# Patient Record
Sex: Male | Born: 1960 | Race: White | Hispanic: No | Marital: Married | Smoking: Never smoker
Health system: Southern US, Community
[De-identification: ages and names within clinical notes are randomized; demographics above are authoritative.]

## PROBLEM LIST (undated history)

## (undated) DIAGNOSIS — R310 Gross hematuria: Secondary | ICD-10-CM

## (undated) DIAGNOSIS — D126 Benign neoplasm of colon, unspecified: Secondary | ICD-10-CM

## (undated) DIAGNOSIS — E782 Mixed hyperlipidemia: Secondary | ICD-10-CM

## (undated) DIAGNOSIS — E049 Nontoxic goiter, unspecified: Secondary | ICD-10-CM

## (undated) DIAGNOSIS — Q7649 Other congenital malformations of spine, not associated with scoliosis: Secondary | ICD-10-CM

## (undated) DIAGNOSIS — M7122 Synovial cyst of popliteal space [Baker], left knee: Secondary | ICD-10-CM

## (undated) DIAGNOSIS — T7840XA Allergy, unspecified, initial encounter: Secondary | ICD-10-CM

## (undated) DIAGNOSIS — K4091 Unilateral inguinal hernia, without obstruction or gangrene, recurrent: Secondary | ICD-10-CM

## (undated) DIAGNOSIS — L0501 Pilonidal cyst with abscess: Secondary | ICD-10-CM

## (undated) DIAGNOSIS — Z87442 Personal history of urinary calculi: Secondary | ICD-10-CM

## (undated) DIAGNOSIS — L649 Androgenic alopecia, unspecified: Secondary | ICD-10-CM

## (undated) HISTORY — DX: Mixed hyperlipidemia: E78.2

## (undated) HISTORY — DX: Unilateral inguinal hernia, without obstruction or gangrene, recurrent: K40.91

## (undated) HISTORY — PX: COLONOSCOPY: SHX174

## (undated) HISTORY — DX: Benign neoplasm of colon, unspecified: D12.6

## (undated) HISTORY — DX: Allergy, unspecified, initial encounter: T78.40XA

## (undated) HISTORY — PX: KNEE ARTHROSCOPY: SUR90

## (undated) HISTORY — DX: Pilonidal cyst with abscess: L05.01

## (undated) HISTORY — DX: Androgenic alopecia, unspecified: L64.9

## (undated) HISTORY — DX: Gross hematuria: R31.0

## (undated) HISTORY — DX: Nontoxic goiter, unspecified: E04.9

## (undated) HISTORY — PX: POLYPECTOMY: SHX149

## (undated) HISTORY — PX: BIOPSY THYROID: PRO38

## (undated) HISTORY — DX: Other congenital malformations of spine, not associated with scoliosis: Q76.49

---
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
## 2001-11-11 ENCOUNTER — Emergency Department (HOSPITAL_COMMUNITY): Admission: EM | Admit: 2001-11-11 | Discharge: 2001-11-11 | Payer: Self-pay | Admitting: Emergency Medicine

## 2005-08-11 ENCOUNTER — Ambulatory Visit: Payer: Self-pay | Admitting: Internal Medicine

## 2005-12-19 ENCOUNTER — Ambulatory Visit: Payer: Self-pay | Admitting: Internal Medicine

## 2006-08-30 ENCOUNTER — Emergency Department (HOSPITAL_COMMUNITY): Admission: EM | Admit: 2006-08-30 | Discharge: 2006-08-30 | Payer: Self-pay | Admitting: Family Medicine

## 2006-11-02 ENCOUNTER — Emergency Department (HOSPITAL_COMMUNITY): Admission: EM | Admit: 2006-11-02 | Discharge: 2006-11-02 | Payer: Self-pay | Admitting: Emergency Medicine

## 2008-11-04 ENCOUNTER — Emergency Department (HOSPITAL_COMMUNITY): Admission: EM | Admit: 2008-11-04 | Discharge: 2008-11-04 | Payer: Self-pay | Admitting: Emergency Medicine

## 2008-11-13 ENCOUNTER — Ambulatory Visit: Payer: Self-pay | Admitting: Sports Medicine

## 2008-11-13 ENCOUNTER — Encounter: Admission: RE | Admit: 2008-11-13 | Discharge: 2008-11-13 | Payer: Self-pay | Admitting: Vascular Surgery

## 2008-12-02 ENCOUNTER — Ambulatory Visit: Payer: Self-pay | Admitting: Sports Medicine

## 2008-12-02 DIAGNOSIS — Q7649 Other congenital malformations of spine, not associated with scoliosis: Secondary | ICD-10-CM

## 2008-12-02 HISTORY — DX: Other congenital malformations of spine, not associated with scoliosis: Q76.49

## 2008-12-30 ENCOUNTER — Ambulatory Visit: Payer: Self-pay | Admitting: Sports Medicine

## 2009-01-02 ENCOUNTER — Encounter: Payer: Self-pay | Admitting: Sports Medicine

## 2009-01-07 ENCOUNTER — Encounter: Payer: Self-pay | Admitting: Sports Medicine

## 2009-01-14 ENCOUNTER — Encounter: Payer: Self-pay | Admitting: Sports Medicine

## 2009-01-19 ENCOUNTER — Encounter: Payer: Self-pay | Admitting: Sports Medicine

## 2009-01-20 ENCOUNTER — Encounter: Admission: RE | Admit: 2009-01-20 | Discharge: 2009-04-20 | Payer: Self-pay | Admitting: Sports Medicine

## 2009-01-21 ENCOUNTER — Encounter: Payer: Self-pay | Admitting: Sports Medicine

## 2009-02-04 ENCOUNTER — Encounter: Admission: RE | Admit: 2009-02-04 | Discharge: 2009-02-04 | Payer: Self-pay | Admitting: Family Medicine

## 2009-02-04 ENCOUNTER — Ambulatory Visit: Payer: Self-pay | Admitting: Family Medicine

## 2009-02-04 ENCOUNTER — Ambulatory Visit: Payer: Self-pay | Admitting: Sports Medicine

## 2009-02-04 LAB — CONVERTED CEMR LAB
Anti Nuclear Antibody(ANA): NEGATIVE
Basophils Absolute: 0.1 10*3/uL (ref 0.0–0.1)
CRP, High Sensitivity: 38.8 — ABNORMAL HIGH
Eosinophils Absolute: 0.1 10*3/uL (ref 0.0–0.7)
Eosinophils Relative: 1 % (ref 0–5)
HCT: 39.8 % (ref 39.0–52.0)
Hemoglobin: 13.2 g/dL (ref 13.0–17.0)
Lymphocytes Relative: 20 % (ref 12–46)
MCV: 84.7 fL (ref 78.0–100.0)
Monocytes Absolute: 0.7 10*3/uL (ref 0.1–1.0)
RDW: 13.3 % (ref 11.5–15.5)

## 2009-02-06 ENCOUNTER — Encounter (INDEPENDENT_AMBULATORY_CARE_PROVIDER_SITE_OTHER): Payer: Self-pay | Admitting: *Deleted

## 2009-02-09 ENCOUNTER — Encounter: Payer: Self-pay | Admitting: Sports Medicine

## 2009-02-11 ENCOUNTER — Ambulatory Visit (HOSPITAL_COMMUNITY): Admission: RE | Admit: 2009-02-11 | Discharge: 2009-02-11 | Payer: Self-pay | Admitting: Rheumatology

## 2009-02-12 ENCOUNTER — Encounter (INDEPENDENT_AMBULATORY_CARE_PROVIDER_SITE_OTHER): Payer: Self-pay | Admitting: Interventional Radiology

## 2009-02-12 ENCOUNTER — Encounter: Payer: Self-pay | Admitting: Endocrinology

## 2009-02-12 ENCOUNTER — Ambulatory Visit (HOSPITAL_COMMUNITY): Admission: RE | Admit: 2009-02-12 | Discharge: 2009-02-12 | Payer: Self-pay | Admitting: Rheumatology

## 2009-02-17 ENCOUNTER — Encounter: Payer: Self-pay | Admitting: Sports Medicine

## 2009-03-02 ENCOUNTER — Ambulatory Visit: Payer: Self-pay | Admitting: Endocrinology

## 2009-03-02 DIAGNOSIS — E041 Nontoxic single thyroid nodule: Secondary | ICD-10-CM | POA: Insufficient documentation

## 2009-03-02 DIAGNOSIS — E049 Nontoxic goiter, unspecified: Secondary | ICD-10-CM

## 2009-03-02 HISTORY — DX: Nontoxic goiter, unspecified: E04.9

## 2009-05-07 ENCOUNTER — Ambulatory Visit (HOSPITAL_COMMUNITY): Admission: RE | Admit: 2009-05-07 | Discharge: 2009-05-07 | Payer: Self-pay

## 2009-08-12 ENCOUNTER — Telehealth: Payer: Self-pay | Admitting: Endocrinology

## 2009-08-14 ENCOUNTER — Ambulatory Visit: Payer: Self-pay | Admitting: Endocrinology

## 2009-08-17 ENCOUNTER — Ambulatory Visit: Payer: Self-pay | Admitting: Endocrinology

## 2009-08-25 ENCOUNTER — Ambulatory Visit (HOSPITAL_COMMUNITY): Admission: RE | Admit: 2009-08-25 | Discharge: 2009-08-25 | Payer: Self-pay | Admitting: Endocrinology

## 2010-08-10 ENCOUNTER — Ambulatory Visit: Payer: Self-pay | Admitting: Emergency Medicine

## 2010-08-11 ENCOUNTER — Telehealth (INDEPENDENT_AMBULATORY_CARE_PROVIDER_SITE_OTHER): Payer: Self-pay

## 2010-11-11 NOTE — Progress Notes (Signed)
  Phone Note Outgoing Call   Call placed by: Areta Haber CMA,  August 11, 2010 10:09 AM Call placed to: Patient Summary of Call: Courtesy call to pt - Pt states he's improving from 08/10/10 vist. Pt was reminded to complete all antibiotics as intructed. Pt stated he understood. Initial call taken by: Areta Haber CMA,  August 11, 2010 10:10 AM

## 2010-11-11 NOTE — Assessment & Plan Note (Signed)
Summary: INJURY TO BIG TOE LEFT FOOT/TJ   Vital Signs:  Patient Profile:   50 Years Old Male CC:      left great toe pain Height:     72 inches (182.88 cm) Weight:      233.25 pounds O2 Sat:      98 % O2 treatment:    Room Air Temp:     98.6 degrees F oral Pulse rate:   69 / minute Resp:     16 per minute BP sitting:   147 / 80  (left arm) Cuff size:   large  Pt. in pain?   yes    Location:   left great toe    Intensity:   7    Type:       throbbing  Vitals Entered By: Lajean Saver RN (August 10, 2010 7:11 PM)                   Updated Prior Medication List: No Medications Current Allergies: ! BETA BLOCKERS ! * IVP DYE IBUPROFENHistory of Present Illness History from: patient Chief Complaint: left great toe pain History of Present Illness: L great toe pain.  Kicked a soccer ball last week with the opposite foot and the foot came back and hit his L big toe.  Became red and swollen and started to drain and was getting better.  Then bumped it again and it got swollen again.  Tenderness, pain, redness.  No current drainage.  No F/C/N/V.  REVIEW OF SYSTEMS Constitutional Symptoms      Denies fever, chills, night sweats, weight loss, weight gain, and fatigue.  Eyes       Denies change in vision, eye pain, eye discharge, glasses, contact lenses, and eye surgery. Ear/Nose/Throat/Mouth       Denies hearing loss/aids, change in hearing, ear pain, ear discharge, dizziness, frequent runny nose, frequent nose bleeds, sinus problems, sore throat, hoarseness, and tooth pain or bleeding.  Respiratory       Denies dry cough, productive cough, wheezing, shortness of breath, asthma, bronchitis, and emphysema/COPD.  Cardiovascular       Denies murmurs, chest pain, and tires easily with exhertion.    Gastrointestinal       Denies stomach pain, nausea/vomiting, diarrhea, constipation, blood in bowel movements, and indigestion. Genitourniary       Denies painful urination, kidney  stones, and loss of urinary control. Neurological       Denies paralysis, seizures, and fainting/blackouts. Musculoskeletal       Complains of joint pain, redness, and swelling.      Denies muscle pain, joint stiffness, decreased range of motion, muscle weakness, and gout.      Comments: left great toe Skin       Complains of hair/skni or nail changes.      Denies bruising and unusual mles/lumps or sores.  Psych       Denies mood changes, temper/anger issues, anxiety/stress, speech problems, depression, and sleep problems. Other Comments: Patient bumped toe on shoe about 1 week ago. He hit it again three days later and then peeled off part of the nail that was hanging. Since it has become very swollen, red, and painful   Past History:  Past Medical History: Reviewed history from 08/17/2009 and no changes required. 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 orthoscopic surgery on left eye - tore a ligament in 1983  Family History: Reviewed history from 03/02/2009 and no changes required. 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  Never Smoked Alcohol use-yes Drug use-no Smoking Status:  never Drug Use:  no Physical Exam General appearance: well developed, well nourished, no acute distress.  Mild limp. Extremities: see below Neurological: normal sensation Skin: see below L great toe with redness around medial toenail c/w paronychia.  Tender at that area.  MTP and IP are FROM, no tednerness.  Squeezing bone is not painful.  Toe is  slightly swollen and erythema dorsal aspect.  The rest of his toes and his foot exam are normal. Assessment New Problems: PARONYCHIA, LEFT GREAT TOE (ICD-681.11) TOE PAIN (ICD-729.5)   Patient Education: Patient and/or caregiver instructed in the following: rest, fluids, Tylenol prn.  Plan New Medications/Changes: BACTRIM DS 800-160 MG TABS (SULFAMETHOXAZOLE-TRIMETHOPRIM) 1 tab by mouth two times a day for 10 days  #20 x 0, 08/10/2010, Hoyt Koch MD  New Orders: New Patient Level III [phone] Planning Comments:   Rx for Bactrim is given as the [facility].  Because of his h/o Ibuprofen allergy, caution with taking any antibiotics or anti-inflammatories.  If any reaction, stop medicine and seek medical attention.  Use Tylenol instead for pain.  Take antibiotics and soak a few times a day in warm water.  Avoid further trauma, including playing soccer until healed.   If not improving in about a week, patient should see podiatry for further evaluation.   The patient and/or caregiver has been counseled thoroughly with regard to medications prescribed including dosage, schedule, interactions, rationale for use, and possible side effects and they verbalize understanding.  Diagnoses and expected course of recovery discussed and will return if not improved as expected or if the condition worsens. Patient and/or caregiver verbalized understanding.  Prescriptions: BACTRIM DS 800-160 MG TABS (SULFAMETHOXAZOLE-TRIMETHOPRIM) 1 tab by mouth two times a day for 10 days  #20 x 0   Entered and Authorized by:   Hoyt Koch MD   Signed by:   Hoyt Koch MD on 08/10/2010   Method used:   Print then Give to Patient   RxID:   [id number]   Orders Added: 1)  New Patient Level III [95621]

## 2011-01-24 LAB — CK: Total CK: 166 U/L (ref 7–232)

## 2011-03-31 ENCOUNTER — Ambulatory Visit: Payer: Self-pay

## 2011-07-15 ENCOUNTER — Inpatient Hospital Stay (INDEPENDENT_AMBULATORY_CARE_PROVIDER_SITE_OTHER)
Admission: RE | Admit: 2011-07-15 | Discharge: 2011-07-15 | Disposition: A | Discharge status: Home / Self Care | Payer: 59 | Source: Ambulatory Visit | Attending: Family Medicine | Admitting: Family Medicine

## 2011-07-15 ENCOUNTER — Encounter: Payer: Self-pay | Admitting: Family Medicine

## 2011-07-15 DIAGNOSIS — L0501 Pilonidal cyst with abscess: Secondary | ICD-10-CM

## 2011-07-15 HISTORY — DX: Pilonidal cyst with abscess: L05.01

## 2011-09-12 NOTE — Progress Notes (Signed)
Summary: cyst on tail bone Procedure room   Vital Signs:  Patient Profile:   50 Years Old Male CC:      Cyst on coccyx x 4-5 days Height:     72 inches (182.88 cm) Weight:      233 pounds O2 Sat:      97 % O2 treatment:    Room Air Temp:     98.9 degrees F oral Pulse rate:   71 / minute Pulse rhythm:   regular Resp:     16 per minute BP sitting:   127 / 83  (left arm) Cuff size:   regular  Vitals Entered By: Emilio Math (July 15, 2011 1:44 PM)                  Prior Medication List:  BACTRIM DS 800-160 MG TABS (SULFAMETHOXAZOLE-TRIMETHOPRIM) 1 tab by mouth two times a day for 10 days   Current Allergies (reviewed today): ! BETA BLOCKERS ! * IVP DYE IBUPROFENHistory of Present Illness Chief Complaint: Cyst on coccyx x 4-5 days History of Present Illness: Patient ghas noted pain on the cocyx area. Hx of pilonidal cyst before.  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     Denies chills, night sweats, weight loss, weight gain, and fatigue.  Eyes       Denies change in vision, eye pain, eye discharge, glasses, contact lenses, and eye surgery. Ear/Nose/Throat/Mouth       Denies hearing loss/aids, change in hearing, ear pain, ear discharge, dizziness, frequent runny nose, frequent nose bleeds, sinus problems, sore throat, hoarseness, and tooth pain or bleeding.  Respiratory       Denies dry cough, productive cough, wheezing,  shortness of breath, asthma, bronchitis, and emphysema/COPD.  Cardiovascular       Denies murmurs, chest pain, and tires easily with exhertion.    Gastrointestinal       Denies stomach pain, nausea/vomiting, diarrhea, constipation, blood in bowel movements, and indigestion. Genitourniary       Denies painful urination, kidney stones, and loss of urinary control. Neurological       Denies paralysis, seizures, and fainting/blackouts. Musculoskeletal       Denies muscle pain, joint pain, joint stiffness, decreased range of motion, redness, swelling, muscle weakness, and gout.  Skin       Complains of unusual moles/lumps or sores.      Denies bruising and hair/skin or nail changes.  Psych       Denies mood changes, temper/anger issues, anxiety/stress, speech problems, depression, and sleep problems.  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  Never Smoked Alcohol use-yes Drug use-no  Risk Factors: Smoking Status: never (08/10/2010)  Past Medical History: Reviewed history from 08/17/2009 and no changes required. 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 orthoscopic surgery on left eye - tore a ligament in 1983  Family History: Reviewed history from 03/02/2009 and no changes required. mother had a thyroidectomy in the 1960's--benign "hot nodules."  Social History: Reviewed history from 08/10/2010 and no changes required. Married to head of ICU  dept at [employer]. [employer] as Product/process development scientist - residential homes.  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) MUSCLE STRAIN, HAMSTRING MUSCLE (ICD-844.8) New Problems: PILONIDAL CYST WITH ABSCESS (ICD-685.0)   Plan New Medications/Changes: BACTROBAN 2 % OINT (MUPIROCIN) appply to area 3x am day  #1 tube x 2, 07/15/2011, Hassan Rowan MD DOXYCYCLINE HYCLATE 100 MG CAPS (DOXYCYCLINE HYCLATE) Take 1 tab twice a day  #20 x 0, 07/15/2011, Hassan Rowan MD  New Orders: New Patient Level III [phone] Planning Comments:   if not improving over next 3-4 days may need I&D at that time  Follow Up: Follow up in 2-3 days if no improvement  The patient and/or caregiver has been counseled thoroughly with regard to medications prescribed including dosage, schedule, interactions, rationale for use, and possible side effects and they verbalize understanding.  Diagnoses and expected course of recovery discussed and will return if not improved as expected or if the condition worsens. Patient and/or caregiver verbalized understanding.  Prescriptions: BACTROBAN 2 % OINT (MUPIROCIN) appply to area 3x am day  #1 tube x 2   Entered and Authorized by:   Hassan Rowan MD   Signed by:   Hassan Rowan MD on 07/15/2011   Method used:   Printed then faxed to ...       [facility]  [address] [phone]* (retail)       [address] [place]  [address]        Ph: [phone] or [phone]       Fax: [phone]   RxID:   [id number] DOXYCYCLINE HYCLATE 100 MG CAPS (DOXYCYCLINE HYCLATE) Take 1 tab twice a day  #20 x 0   Entered and Authorized by:   Hassan Rowan MD   Signed by:   Hassan Rowan MD on 07/15/2011   Method used:   Printed then faxed to ...       [facility]  [address] [phone]* (retail)       [address] [place]  [address]       Ph: [phone] or [phone]       Fax: [phone]   RxID:   [id number]   Patient Instructions: 1)  Please schedule a follow-up appointment as needed. 2)  Please schedule an appointment with your primary doctor in :3-4 days if needed 3)  Take your antibiotic as prescribed until ALL of it is  gone, but stop if you develop a rash or swelling and contact our office as soon as possible.  Orders Added: 1)  New Patient Level III [[address]109]

## 2011-09-15 ENCOUNTER — Emergency Department
Admission: EM | Admit: 2011-09-15 | Discharge: 2011-09-15 | Disposition: A | Discharge status: Home / Self Care | Payer: 59 | Source: Home / Self Care

## 2011-09-15 ENCOUNTER — Encounter: Payer: Self-pay | Admitting: *Deleted

## 2011-09-15 DIAGNOSIS — Z23 Encounter for immunization: Secondary | ICD-10-CM

## 2011-09-15 MED ORDER — INFLUENZA VAC TYP A&B SURF ANT IM INJ
0.5000 mL | INJECTION | INTRAMUSCULAR | Status: AC | PRN
  Administered 2011-09-15: 0.5 mL via INTRAMUSCULAR

## 2011-09-15 MED ORDER — INFLUENZA VAC TYP A&B SURF ANT IM INJ: 0.5000 mL | INJECTION | INTRAMUSCULAR | Status: DC

## 2012-04-06 ENCOUNTER — Telehealth: Payer: Self-pay

## 2012-04-06 NOTE — Telephone Encounter (Signed)
error 

## 2012-04-27 ENCOUNTER — Emergency Department
Admission: EM | Admit: 2012-04-27 | Discharge: 2012-04-27 | Disposition: A | Discharge status: Home / Self Care | Payer: 59 | Source: Home / Self Care

## 2012-04-27 DIAGNOSIS — R221 Localized swelling, mass and lump, neck: Secondary | ICD-10-CM

## 2012-04-27 NOTE — ED Provider Notes (Signed)
History     CSN: [id number]  Arrival date & time 04/27/12  1442   First MD Initiated Contact with Patient 04/27/12 1446      Chief Complaint  Patient presents with  . Mass    on right side of neck     HPI Comments: Pt presents today with chief complaint of R neck mass Pt states that he noticed mass 1-2 days ago while he was in the shower.  Pt denies noticing mass prior to this.  Pt denies pain or irritation.  No redness.  Pt denies any recent infections.  No trauma. No prior hx/o malignancy though pt does report hx/o benign thyroid mass that has been imaged and biopsied Pt also reports hx/o idiopathic angioedema which he thinks is related to ibuprofen use.  No recent episodes of angioedema.  No rashes. No joint pain, myalgias, or easy bruising.  No unintentional weight loss.  No alcohol intake.  Pt is a non smoker.      History reviewed. No pertinent past medical history.  Past Surgical History  Procedure Date  . Knee arthroscopy     left    Family History  Problem Relation Age of Onset  . Cancer Father     bladder    History  Substance Use Topics  . Smoking status: Never Smoker   . Smokeless tobacco: Never Used  . Alcohol Use: 4.8 oz/week    8 Cans of beer per week      Review of Systems  All other systems reviewed and are negative.    Allergies  Beta adrenergic blockers and Ibuprofen  Home Medications   Current Outpatient Rx  Name Route Sig Dispense Refill  . CETIRIZINE HCL 10 MG PO TABS Oral Take 10 mg by mouth daily.      BP 122/75  Pulse 65  Temp 98.2 F (36.8 C) (Oral)  Resp 16  Ht 6' (1.829 m)  Wt 235 lb (106.595 kg)  BMI 31.87 kg/m2  SpO2 97%  Physical Exam  Constitutional: He appears well-developed and well-nourished.  HENT:  Head: Normocephalic and atraumatic.  Right Ear: External ear normal.  Left Ear: External ear normal.  Mouth/Throat: Oropharynx is clear and moist.  Eyes: Conjunctivae are normal. Pupils are equal,  round, and reactive to light.  Neck: Normal range of motion. No JVD present. No tracheal deviation present.    Cardiovascular: Normal rate and regular rhythm.   Pulmonary/Chest: Effort normal and breath sounds normal. No stridor.  Abdominal: Soft. Bowel sounds are normal.  Musculoskeletal: Normal range of motion.  Lymphadenopathy:    He has no cervical adenopathy.  Neurological: He is alert.  Skin: Skin is warm. No rash noted.    ED Course  Procedures (including critical care time)  Labs Reviewed - No data to display No results found.   No diagnosis found.    MDM  Differential remains relatively broad.  Lymphadenitis less likely given lack of systemic sxs and nontender mass.  Given hx/o thyroid mass (benign) and location (posterior triangle), higher concern for malignancy.  Pt formally referred for same day appt to ENT for evaluation (pt going out of town early next week.) SDA made for [facility]. Pt to see Dr. Jenne Pane.  Pt informed of follow up and is agreeable.          Floydene Flock, MD 04/27/12 [phone]

## 2012-04-27 NOTE — ED Notes (Signed)
Gary Collins complains of bump on the left side of his neck for 1 day. Denies fever, chill, sweats, tenderness, pain or redness.

## 2012-04-28 NOTE — ED Provider Notes (Signed)
Agree with exam, assessment, and plan.   Lattie Haw, MD 04/28/12 [phone]

## 2012-06-12 ENCOUNTER — Emergency Department (INDEPENDENT_AMBULATORY_CARE_PROVIDER_SITE_OTHER)
Admission: EM | Admit: 2012-06-12 | Discharge: 2012-06-12 | Disposition: A | Discharge status: Home / Self Care | Payer: 59 | Source: Home / Self Care | Attending: Family Medicine | Admitting: Family Medicine

## 2012-06-12 ENCOUNTER — Encounter (HOSPITAL_COMMUNITY): Payer: Self-pay | Admitting: *Deleted

## 2012-06-12 ENCOUNTER — Emergency Department (HOSPITAL_COMMUNITY)
Admission: EM | Admit: 2012-06-12 | Discharge: 2012-06-12 | Payer: Self-pay | Attending: Family Medicine | Admitting: Family Medicine

## 2012-06-12 DIAGNOSIS — H6120 Impacted cerumen, unspecified ear: Secondary | ICD-10-CM

## 2012-06-12 DIAGNOSIS — L255 Unspecified contact dermatitis due to plants, except food: Secondary | ICD-10-CM

## 2012-06-12 DIAGNOSIS — L237 Allergic contact dermatitis due to plants, except food: Secondary | ICD-10-CM

## 2012-06-12 DIAGNOSIS — H918X9 Other specified hearing loss, unspecified ear: Secondary | ICD-10-CM

## 2012-06-12 MED ORDER — FLUTICASONE PROPIONATE 0.05 % EX CREA: TOPICAL_CREAM | Freq: Two times a day (BID) | CUTANEOUS | Status: DC

## 2012-06-12 NOTE — ED Notes (Signed)
Pt reports wax build up in right ear and posion ivy on legs

## 2012-06-12 NOTE — ED Provider Notes (Signed)
History     CSN: [id number]  Arrival date & time 06/12/12  1618   First MD Initiated Contact with Patient 06/12/12 1624      Chief Complaint  Patient presents with  . Cerumen Impaction    (Consider location/radiation/quality/duration/timing/severity/associated sxs/prior treatment) Patient is a 51 y.o. male presenting with plugged ear sensation. The history is provided by the patient.  Ear Fullness This is a new problem. The current episode started more than 2 days ago (tubing on lake and water impact caused ear blockage.). The problem has not changed since onset.   History reviewed. No pertinent past medical history.  Past Surgical History  Procedure Date  . Knee arthroscopy     left    Family History  Problem Relation Age of Onset  . Cancer Father     bladder    History  Substance Use Topics  . Smoking status: Never Smoker   . Smokeless tobacco: Never Used  . Alcohol Use: 4.8 oz/week    8 Cans of beer per week      Review of Systems  Constitutional: Negative.   HENT: Positive for hearing loss. Negative for ear pain, tinnitus and ear discharge.     Allergies  Beta adrenergic blockers and Ibuprofen  Home Medications   Current Outpatient Rx  Name Route Sig Dispense Refill  . CETIRIZINE HCL 10 MG PO TABS Oral Take 10 mg by mouth daily.    Marland Kitchen FLUTICASONE PROPIONATE 0.05 % EX CREA Topical Apply topically 2 (two) times daily. 60 g 0    BP 118/70  Pulse 64  Temp 97.8 F (36.6 C) (Oral)  Resp 18  SpO2 98%  Physical Exam  Nursing note and vitals reviewed. Constitutional: He appears well-developed and well-nourished.  HENT:  Head: Normocephalic.  Ears:  Mouth/Throat: Oropharynx is clear and moist.  Skin: Skin is warm and dry. Rash noted.       Papulovesicular patchy rash on lower ext.    ED Course  Procedures (including critical care time)  Labs Reviewed - No data to display No results found.   1. Hearing loss secondary to cerumen impaction     2. Contact dermatitis due to poison ivy       MDM  Sx resolved with irrig, canals clear and tm nl bilat.        Linna Hoff, MD 06/12/12 [phone]

## 2012-08-06 ENCOUNTER — Telehealth: Payer: Self-pay | Admitting: Endocrinology

## 2012-08-06 NOTE — Telephone Encounter (Signed)
The patient called hoping to get a referral for a MRI of his thyroid.

## 2012-08-07 NOTE — Telephone Encounter (Signed)
Ov would be needed to consider 

## 2012-08-07 NOTE — Telephone Encounter (Signed)
Called Gary Collins and advised he needs an OV; not seen in 3 yrs. Gary Collins stated he did not want an MRI, he wants an untrasound. I told Gary Collins I would check with Dr. Everardo All. Please advise.

## 2012-08-07 NOTE — Telephone Encounter (Signed)
Last seen 3 years ago.  Please advise ov

## 2012-08-07 NOTE — Telephone Encounter (Signed)
Called pt and left a vm letting him know that he would need an OV to discuss and consider having an ultrasound on his thyroid.

## 2012-08-07 NOTE — Telephone Encounter (Signed)
Pt called requesting a referral for MRI of his thyroid. Please advise.

## 2012-09-14 ENCOUNTER — Encounter: Payer: Self-pay | Admitting: Endocrinology

## 2012-09-14 ENCOUNTER — Ambulatory Visit (INDEPENDENT_AMBULATORY_CARE_PROVIDER_SITE_OTHER): Payer: 59 | Admitting: Endocrinology

## 2012-09-14 VITALS — BP 126/80 | HR 66 | Temp 98.5°F | Wt 239.0 lb

## 2012-09-14 DIAGNOSIS — E049 Nontoxic goiter, unspecified: Secondary | ICD-10-CM

## 2012-09-14 LAB — TSH: TSH: 1.433 u[IU]/mL (ref 0.350–4.500)

## 2012-09-14 NOTE — Patient Instructions (Addendum)
blood tests are being requested for you today.  We'll contact you with results. Let's recheck the ultrasound.  you will receive a phone call, about a day and time for an appointment Please return in 2 years.

## 2012-09-14 NOTE — Progress Notes (Signed)
  Subjective:    Patient ID: Gary Collins, male    DOB: [birth year], 51 y.o.   MRN: [id number]  HPI In 2012, pt was incidentally noted on MRI to have a right thyroid mass.  bx showed non-neoplastic goiter.  He does not notice the goiter.  pt states he feels well in general.   No past medical history on file.  Past Surgical History  Procedure Date  . Knee arthroscopy     left    History   Social History  . Marital Status: Married    Spouse Name: [name]    Number of Children: [name]  . Years of Education: [name]   Occupational History  . Not on file.   Social History Main Topics  . Smoking status: Never Smoker   . Smokeless tobacco: Never Used  . Alcohol Use: 4.8 oz/week    8 Cans of beer per week  . Drug Use: No  . Sexually Active:    Other Topics Concern  . Not on file   Social History Narrative  . No narrative on file    Current Outpatient Prescriptions on File Prior to Visit  Medication Sig Dispense Refill  . cetirizine (ZYRTEC) 10 MG tablet Take 10 mg by mouth daily.      . fluticasone (CUTIVATE) 0.05 % cream Apply topically 2 (two) times daily.  60 g  0    Allergies  Allergen Reactions  . Beta Adrenergic Blockers   . Ibuprofen     REACTION: lip swelling    Family History  Problem Relation Age of Onset  . Cancer Father     bladder   BP 126/80  Pulse 66  Temp 98.5 F (36.9 C) (Oral)  Wt 239 lb (108.41 kg)  SpO2 98%  Review of Systems Denies tremor.      Objective:   Physical Exam VITAL SIGNS:  See vs page GENERAL: no distress NECK: There is no palpable thyroid enlargement.  No thyroid nodule is palpable.  No palpable lymphadenopathy at the anterior neck.  Lab Results  Component Value Date   TSH 1.433 09/14/2012      Assessment & Plan:  Thyroid nodule, nonpalpable.  due for f/u US.

## 2012-09-18 ENCOUNTER — Other Ambulatory Visit: Payer: 59

## 2012-09-18 ENCOUNTER — Telehealth: Payer: Self-pay | Admitting: Endocrinology

## 2012-09-18 NOTE — Telephone Encounter (Signed)
DEBORAH, PCC, AT [facility] NOTIFIED OF PATIENT REQUEST TO HAVE ULTRASOUND AT [facility]. DEBORAH TO CALL PATIENT WITH NEW DATE AND TIME.

## 2012-09-18 NOTE — Telephone Encounter (Signed)
Patient states that he has an ultrasound scheduled for this afternoon at [facility]. He would like to cancel this appointment and have the ultrasound done at [facility]. He states any day is fine from 8-1. He says that the reason he is cancelling this appointment is because his wife works for [employer] and would be less expensive this way.

## 2012-09-19 ENCOUNTER — Ambulatory Visit (HOSPITAL_COMMUNITY): Payer: 59

## 2012-09-20 ENCOUNTER — Telehealth: Payer: Self-pay | Admitting: *Deleted

## 2012-09-20 ENCOUNTER — Ambulatory Visit (HOSPITAL_COMMUNITY)
Admission: RE | Admit: 2012-09-20 | Discharge: 2012-09-20 | Disposition: A | Discharge status: Home / Self Care | Payer: 59 | Source: Ambulatory Visit | Attending: Endocrinology | Admitting: Endocrinology

## 2012-09-20 DIAGNOSIS — E041 Nontoxic single thyroid nodule: Secondary | ICD-10-CM | POA: Insufficient documentation

## 2012-09-20 NOTE — Telephone Encounter (Signed)
Message copied by Elnora Morrison on Thu Sep 20, 2012  4:29 PM ------      Message from: Romero Belling      Created: Thu Sep 20, 2012  3:36 PM       please call patient:      No change--good      i'll see you next time.

## 2012-09-20 NOTE — Telephone Encounter (Signed)
PATIENT NOTIFIED OF U/S REPORT PER DR. ELLISON OF NO CHANGE.

## 2012-10-08 ENCOUNTER — Ambulatory Visit (INDEPENDENT_AMBULATORY_CARE_PROVIDER_SITE_OTHER): Payer: 59 | Admitting: Family Medicine

## 2012-10-08 ENCOUNTER — Encounter: Payer: Self-pay | Admitting: Family Medicine

## 2012-10-08 VITALS — BP 134/86 | HR 69 | Temp 98.6°F | Ht 72.0 in | Wt 237.0 lb

## 2012-10-08 DIAGNOSIS — Z23 Encounter for immunization: Secondary | ICD-10-CM

## 2012-10-08 DIAGNOSIS — Z7689 Persons encountering health services in other specified circumstances: Secondary | ICD-10-CM

## 2012-10-08 DIAGNOSIS — Z0000 Encounter for general adult medical examination without abnormal findings: Secondary | ICD-10-CM

## 2012-10-08 DIAGNOSIS — Z7189 Other specified counseling: Secondary | ICD-10-CM

## 2012-10-08 NOTE — Assessment & Plan Note (Signed)
Reviewed history and his upcoming need for CPE. He'll call with info about which GI MD his wife recommends him to see. He'll arrange CPE at his convenience and he'll come by for fasting health panel and PSA the week prior to his CPE. Tdap vaccine given IM today.

## 2012-10-08 NOTE — Progress Notes (Signed)
Office Note 10/08/2012  CC:  Chief Complaint  Patient presents with  . Establish Care    turned 51 this year, last CPE 4-5 years ago; transfer from Dr. Cato Mulligan    HPI:  Gary Collins is a 51 y.o. White male who is here to transfer care. Patient's most recent primary MD: Dr. Cato Mulligan at [facility] [place]. Old records in EPIC/HL were reviewed prior to or during today's visit.  Patient without c/o today.   Past Medical History  Diagnosis Date  . Pilonidal cyst with abscess 07/15/2011    Qualifier: Diagnosis of  By: Thurmond Butts MD, Dennard Nip    . 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  Followed by Dr. Everardo All.   No changes on u/s f/u 09/2012    Past Surgical History  Procedure Date  . Knee arthroscopy     left    Family History  Problem Relation Age of Onset  . Cancer Father     bladder    History   Social History  . Marital Status: Married    Spouse Name: [name]    Number of Children: [name]  . Years of Education: [name]   Occupational History  . Not on file.   Social History Main Topics  . Smoking status: Never Smoker   . Smokeless tobacco: Never Used  . Alcohol Use: 4.8 oz/week    8 Cans of beer per week  . Drug Use: No  . Sexually Active:    Other Topics Concern  . Not on file   Social History Narrative   Married, 50 y/o girl and 25 boy.Occupation: Product/process development scientist (residential).Orig from [place], [place].Enjoys fishing.  Exercises 2-3 times per week: 30 min elliptical.  Also plays soccer.No tob, occ beer, no drugs.    Outpatient Encounter Prescriptions as of 10/08/2012  Medication Sig Dispense Refill  . cetirizine (ZYRTEC) 10 MG tablet Take 10 mg by mouth daily.      . [DISCONTINUED] fluticasone (CUTIVATE) 0.05 % cream Apply topically 2 (two) times daily.  60 g  0  Fish oil 1000 mg bid  Allergies  Allergen Reactions  . Beta Adrenergic Blockers   . Ibuprofen     REACTION: lip swelling   PE; Blood  pressure 134/86, pulse 69, temperature 98.6 F (37 C), temperature source Temporal, height 6' (1.829 m), weight 237 lb (107.502 kg), SpO2 98.00%. Gen: Alert, well appearing.  Patient is oriented to person, place, time, and situation. No further exam today.  Pertinent labs:  Lab Results  Component Value Date   TSH 1.433 09/14/2012     ASSESSMENT AND PLAN:   Encounter to establish care with new doctor Reviewed history and his upcoming need for CPE. He'll call with info about which GI MD his wife recommends him to see. He'll arrange CPE at his convenience and he'll come by for fasting health panel and PSA the week prior to his CPE. Tdap vaccine given IM today.  An After Visit Summary was printed and given to the patient.  Spent with pt today, with >50% of this time spent in counseling and care coordination.  Return for CPE at pt's convenience, with lab visit for fasting blood work the week prior.

## 2012-10-18 ENCOUNTER — Other Ambulatory Visit (INDEPENDENT_AMBULATORY_CARE_PROVIDER_SITE_OTHER): Payer: 59

## 2012-10-18 DIAGNOSIS — Z0000 Encounter for general adult medical examination without abnormal findings: Secondary | ICD-10-CM

## 2012-10-18 LAB — COMPREHENSIVE METABOLIC PANEL
ALT: 18 U/L (ref 0–53)
AST: 30 U/L (ref 0–37)
CO2: 30 mEq/L (ref 19–32)
Calcium: 9 mg/dL (ref 8.4–10.5)
Chloride: 102 mEq/L (ref 96–112)
GFR: 94.83 mL/min (ref >60.00)
Sodium: 138 mEq/L (ref 135–145)
Total Protein: 7.3 g/dL (ref 6.0–8.3)

## 2012-10-18 LAB — TSH: TSH: 1.05 u[IU]/mL (ref 0.35–5.50)

## 2012-10-18 LAB — CBC WITH DIFFERENTIAL/PLATELET
Basophils Absolute: 0.1 10*3/uL (ref 0.0–0.1)
Hemoglobin: 14.4 g/dL (ref 13.0–17.0)
Lymphocytes Relative: 34.3 % (ref 12.0–46.0)
Monocytes Relative: 9.6 % (ref 3.0–12.0)
Platelets: 191 10*3/uL (ref 150.0–400.0)
RDW: 12.7 % (ref 11.5–14.6)

## 2012-10-18 LAB — LIPID PANEL
HDL: 30.3 mg/dL — ABNORMAL LOW (ref >39.00)
Total CHOL/HDL Ratio: 7
Triglycerides: 125 mg/dL (ref 0.0–149.0)
VLDL: 25 mg/dL (ref 0.0–40.0)

## 2012-10-18 NOTE — Progress Notes (Signed)
Labs only

## 2012-10-25 ENCOUNTER — Ambulatory Visit (INDEPENDENT_AMBULATORY_CARE_PROVIDER_SITE_OTHER): Payer: 59 | Admitting: Family Medicine

## 2012-10-25 ENCOUNTER — Encounter: Payer: Self-pay | Admitting: Family Medicine

## 2012-10-25 VITALS — BP 141/93 | HR 63 | Ht 72.0 in | Wt 235.0 lb

## 2012-10-25 DIAGNOSIS — N508 Other specified disorders of male genital organs: Secondary | ICD-10-CM

## 2012-10-25 DIAGNOSIS — N5089 Other specified disorders of the male genital organs: Secondary | ICD-10-CM

## 2012-10-25 DIAGNOSIS — Z0000 Encounter for general adult medical examination without abnormal findings: Secondary | ICD-10-CM

## 2012-10-25 NOTE — Patient Instructions (Signed)
Health Maintenance, Males A healthy lifestyle and preventative care can promote health and wellness.  Maintain regular health, dental, and eye exams.  Eat a healthy diet. Foods like vegetables, fruits, whole grains, low-fat dairy products, and lean protein foods contain the nutrients you need without too many calories. Decrease your intake of foods high in solid fats, added sugars, and salt. Get information about a proper diet from your caregiver, if necessary.  Regular physical exercise is one of the most important things you can do for your health. Most adults should get at least 150 minutes of moderate-intensity exercise (any activity that increases your heart rate and causes you to sweat) each week. In addition, most adults need muscle-strengthening exercises on 2 or more days a week.   Maintain a healthy weight. The body mass index (BMI) is a screening tool to identify possible weight problems. It provides an estimate of body fat based on height and weight. Your caregiver can help determine your BMI, and can help you achieve or maintain a healthy weight. For adults 20 years and older:  A BMI below 18.5 is considered underweight.  A BMI of 18.5 to 24.9 is normal.  A BMI of 25 to 29.9 is considered overweight.  A BMI of 30 and above is considered obese.  Maintain normal blood lipids and cholesterol by exercising and minimizing your intake of saturated fat. Eat a balanced diet with plenty of fruits and vegetables. Blood tests for lipids and cholesterol should begin at age 20 and be repeated every 5 years. If your lipid or cholesterol levels are high, you are over 50, or you are a high risk for heart disease, you may need your cholesterol levels checked more frequently.Ongoing high lipid and cholesterol levels should be treated with medicines, if diet and exercise are not effective.  If you smoke, find out from your caregiver how to quit. If you do not use tobacco, do not start.  If you  choose to drink alcohol, do not exceed 2 drinks per day. One drink is considered to be 12 ounces (355 mL) of beer, 5 ounces (148 mL) of wine, or 1.5 ounces (44 mL) of liquor.  Avoid use of street drugs. Do not share needles with anyone. Ask for help if you need support or instructions about stopping the use of drugs.  High blood pressure causes heart disease and increases the risk of stroke. Blood pressure should be checked at least every 1 to 2 years. Ongoing high blood pressure should be treated with medicines if weight loss and exercise are not effective.  If you are 45 to 51 years old, ask your caregiver if you should take aspirin to prevent heart disease.  Diabetes screening involves taking a blood sample to check your fasting blood sugar level. This should be done once every 3 years, after age 45, if you are within normal weight and without risk factors for diabetes. Testing should be considered at a younger age or be carried out more frequently if you are overweight and have at least 1 risk factor for diabetes.  Colorectal cancer can be detected and often prevented. Most routine colorectal cancer screening begins at the age of 50 and continues through age 75. However, your caregiver may recommend screening at an earlier age if you have risk factors for colon cancer. On a yearly basis, your caregiver may provide home test kits to check for hidden blood in the stool. Use of a small camera at the end of a tube,   to directly examine the colon (sigmoidoscopy or colonoscopy), can detect the earliest forms of colorectal cancer. Talk to your caregiver about this at age 50, when routine screening begins. Direct examination of the colon should be repeated every 5 to 10 years through age 75, unless early forms of pre-cancerous polyps or small growths are found.  Hepatitis C blood testing is recommended for all people born from 1945 through 1965 and any individual with known risks for hepatitis C.  Healthy  men should no longer receive prostate-specific antigen (PSA) blood tests as part of routine cancer screening. Consult with your caregiver about prostate cancer screening.  Testicular cancer screening is not recommended for adolescents or adult males who have no symptoms. Screening includes self-exam, caregiver exam, and other screening tests. Consult with your caregiver about any symptoms you have or any concerns you have about testicular cancer.  Practice safe sex. Use condoms and avoid high-risk sexual practices to reduce the spread of sexually transmitted infections (STIs).  Use sunscreen with a sun protection factor (SPF) of 30 or greater. Apply sunscreen liberally and repeatedly throughout the day. You should seek shade when your shadow is shorter than you. Protect yourself by wearing long sleeves, pants, a wide-brimmed hat, and sunglasses year round, whenever you are outdoors.  Notify your caregiver of new moles or changes in moles, especially if there is a change in shape or color. Also notify your caregiver if a mole is larger than the size of a pencil eraser.  A one-time screening for abdominal aortic aneurysm (AAA) and surgical repair of large AAAs by sound wave imaging (ultrasonography) is recommended for ages 65 to 75 years who are current or former smokers.  Stay current with your immunizations. Document Released: 03/24/2008 Document Revised: 12/19/2011 Document Reviewed: 02/21/2011 Exit[employer] Patient Information 2013 [employer].  

## 2012-10-25 NOTE — Progress Notes (Signed)
Office Note 10/25/2012  CC:  Chief Complaint  Patient presents with  . Annual Exam    no problems; had labs last week    HPI:  Gary Collins is a 52 y.o. White male who is here for CPE--last CPE was about 4 yrs ago. Fasting labs recently were all normal except mildly low HDL.  We discussed doing TLC for at least a 8mo period to try to help this, then recheck FLP.   Past Medical History  Diagnosis Date  . Pilonidal cyst with abscess 07/15/2011    Qualifier: Diagnosis of  By: Thurmond Butts MD, Dennard Nip    . 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  Followed by Dr. Everardo All.   No changes on u/s f/u 09/2012  . 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 Knee arthroscopy     left  . Biopsy thyroid     Family History  Problem Relation Age of Onset  . Cancer Father     bladder    History   Social History  . Marital Status: Married    Spouse Name: [name]    Number of Children: [name]  . Years of Education: [name]   Occupational History  . Not on file.   Social History Main Topics  . Smoking status: Never Smoker   . Smokeless tobacco: Never Used  . Alcohol Use: 4.8 oz/week    8 Cans of beer per week  . Drug Use: No  . Sexually Active:    Other Topics Concern  . 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  Exercises 2-3 times per week: 30 min elliptical.  Also plays soccer.No tob, occ beer, no drugs.    Outpatient Prescriptions Prior to Visit  Medication Sig Dispense Refill  . cetirizine (ZYRTEC) 10 MG tablet Take 10 mg by mouth daily.      Last reviewed on 10/25/2012 11:54 AM by Jeoffrey Massed, MD  Allergies  Allergen Reactions  . Beta Adrenergic Blockers   . Ibuprofen     REACTION: lip swelling     ROS Review of Systems  Constitutional: Negative for fever, chills, appetite change and fatigue.  HENT: Negative for ear pain, congestion, sore throat, neck stiffness and dental problem.   Eyes: Negative for discharge, redness and visual disturbance.  Respiratory: Negative for cough, chest tightness, shortness of breath and wheezing.   Cardiovascular: Negative for chest pain, palpitations and leg swelling.  Gastrointestinal: Negative for nausea, vomiting, abdominal pain, diarrhea and blood in stool.  Genitourinary: Negative for dysuria, urgency, frequency, hematuria, flank pain and difficulty urinating.  Musculoskeletal: Negative for myalgias, back pain, joint swelling and arthralgias.  Skin: Negative for pallor and rash.  Neurological: Negative for dizziness, speech difficulty, weakness and headaches.  Hematological: Negative for adenopathy. Does not bruise/bleed easily.  Psychiatric/Behavioral: Negative for confusion and sleep disturbance. The patient is not nervous/anxious.     PE; Blood pressure 141/93, pulse 63, height 6' (1.829 m), weight 235 lb (106.595 kg), SpO2 100.00%. Gen: Alert, well appearing.  Patient is oriented to person, place, time, and situation. AFFECT: pleasant, lucid thought and speech. ENT: Ears: EACs clear, normal epithelium.  TMs with good light reflex and landmarks bilaterally.  Eyes: no injection, icteris, swelling, or  exudate.  EOMI, PERRLA. Nose: no drainage or turbinate edema/swelling.  No injection or focal lesion.  Mouth: lips without lesion/swelling.  Oral mucosa pink and moist.  Dentition intact and without obvious caries or gingival swelling.  Oropharynx without erythema, exudate, or swelling.  Neck: supple/nontender.  No LAD, mass, or TM.  Carotid pulses 2+ bilaterally, without bruits. CV: RRR, no m/r/g.   LUNGS: CTA bilat, nonlabored resps, good aeration in all lung fields. ABD: soft, NT, ND, BS normal.  No hepatospenomegaly or mass.  No  bruits. EXT: no clubbing, cyanosis, or edema.  Musculoskeletal: no joint swelling, erythema, warmth, or tenderness.  ROM of all joints intact. Skin - no sores or suspicious lesions or rashes or color changes Genitals normal; both testes normal without tenderness or mass.  I feel a soft, indistinct "bag of worms" mass behind the left testicle--separate from the testicle.  This area is nontender.  I don't detect abnormal amount of scrotal fluid.  No scrotal skin abnormality. Shaft normal, circumcised, meatus normal without discharge. No inguinal hernia noted. No inguinal lymphadenopathy.  Pertinent labs:  Lab Results  Component Value Date   TSH 1.05 10/18/2012   Lab Results  Component Value Date   WBC 6.3 10/18/2012   HGB 14.4 10/18/2012   HCT 43.0 10/18/2012   MCV 89.5 10/18/2012   PLT 191.0 10/18/2012   Lab Results  Component Value Date   CREATININE 0.9 10/18/2012   BUN 15 10/18/2012   NA 138 10/18/2012   K 4.2 10/18/2012   CL 102 10/18/2012   CO2 30 10/18/2012   Lab Results  Component Value Date   ALT 18 10/18/2012   AST 30 10/18/2012   ALKPHOS 62 10/18/2012   BILITOT 1.4* 10/18/2012   Lab Results  Component Value Date   CHOL 202* 10/18/2012   Lab Results  Component Value Date   HDL 30.30* 10/18/2012   No results found for this basename: LDLCALC   Lab Results  Component Value Date   TRIG 125.0 10/18/2012   Lab Results  Component Value Date   CHOLHDL 7 10/18/2012   Lab Results  Component Value Date   PSA 0.94 10/18/2012    ASSESSMENT AND PLAN:   Health maintenance examination Reviewed age and gender appropriate health maintenance issues (prudent diet, regular exercise, health risks of tobacco and excessive alcohol, use of seatbelts, fire alarms in home, use of sunscreen).  Also reviewed age and gender appropriate health screening as well as vaccine recommendations. He is UTD on vaccines. He still needs to get a GI MD recommendation from his wife--he'll call back with this so we can refer him for  colon cancer screening. DRE and PSA normal.  Regarding his low HDL, we discussed getting more focused with low fat/low chol diet and increase his exercise if he can. We'll see how his HDL responds in 33mo.  Scrotal mass I feel like this is likely a varicocele but will check scrotal u/s to confirm.    FOLLOW UP:  Return for o/v 33mo with fasting lipid panel the week prior.

## 2012-10-25 NOTE — Assessment & Plan Note (Signed)
Reviewed age and gender appropriate health maintenance issues (prudent diet, regular exercise, health risks of tobacco and excessive alcohol, use of seatbelts, fire alarms in home, use of sunscreen).  Also reviewed age and gender appropriate health screening as well as vaccine recommendations. He is UTD on vaccines. He still needs to get a GI MD recommendation from his wife--he'll call back with this so we can refer him for colon cancer screening. DRE and PSA normal.  Regarding his low HDL, we discussed getting more focused with low fat/low chol diet and increase his exercise if he can. We'll see how his HDL responds in 69mo.

## 2012-10-25 NOTE — Assessment & Plan Note (Signed)
I feel like this is likely a varicocele but will check scrotal u/s to confirm.

## 2012-11-06 ENCOUNTER — Ambulatory Visit (HOSPITAL_COMMUNITY)
Admission: RE | Admit: 2012-11-06 | Discharge: 2012-11-06 | Disposition: A | Discharge status: Home / Self Care | Payer: 59 | Source: Ambulatory Visit | Attending: Family Medicine | Admitting: Family Medicine

## 2012-11-06 DIAGNOSIS — N5089 Other specified disorders of the male genital organs: Secondary | ICD-10-CM

## 2012-11-06 DIAGNOSIS — N508 Other specified disorders of male genital organs: Secondary | ICD-10-CM | POA: Insufficient documentation

## 2013-04-24 ENCOUNTER — Other Ambulatory Visit (INDEPENDENT_AMBULATORY_CARE_PROVIDER_SITE_OTHER): Payer: 59

## 2013-04-24 DIAGNOSIS — E7800 Pure hypercholesterolemia, unspecified: Secondary | ICD-10-CM

## 2013-04-24 LAB — LDL CHOLESTEROL, DIRECT: Direct LDL: 139 mg/dL

## 2013-04-24 LAB — LIPID PANEL
Total CHOL/HDL Ratio: 7
Triglycerides: 283 mg/dL — ABNORMAL HIGH (ref 0.0–149.0)
VLDL: 56.6 mg/dL — ABNORMAL HIGH (ref 0.0–40.0)

## 2013-04-24 NOTE — Progress Notes (Signed)
Labs only

## 2013-05-01 ENCOUNTER — Ambulatory Visit: Payer: 59 | Admitting: Family Medicine

## 2013-05-08 ENCOUNTER — Ambulatory Visit (INDEPENDENT_AMBULATORY_CARE_PROVIDER_SITE_OTHER): Payer: 59 | Admitting: Family Medicine

## 2013-05-08 ENCOUNTER — Encounter: Payer: Self-pay | Admitting: Family Medicine

## 2013-05-08 VITALS — BP 126/74 | HR 54 | Temp 97.8°F | Resp 16 | Ht 72.0 in | Wt 228.0 lb

## 2013-05-08 DIAGNOSIS — J309 Allergic rhinitis, unspecified: Secondary | ICD-10-CM

## 2013-05-08 DIAGNOSIS — Z0000 Encounter for general adult medical examination without abnormal findings: Secondary | ICD-10-CM

## 2013-05-08 DIAGNOSIS — J310 Chronic rhinitis: Secondary | ICD-10-CM | POA: Insufficient documentation

## 2013-05-08 DIAGNOSIS — R229 Localized swelling, mass and lump, unspecified: Secondary | ICD-10-CM | POA: Insufficient documentation

## 2013-05-08 DIAGNOSIS — E785 Hyperlipidemia, unspecified: Secondary | ICD-10-CM | POA: Insufficient documentation

## 2013-05-08 DIAGNOSIS — J300 Vasomotor rhinitis: Secondary | ICD-10-CM

## 2013-05-08 MED ORDER — AZELASTINE HCL 0.15 % NA SOLN: NASAL | Status: DC

## 2013-05-08 NOTE — Assessment & Plan Note (Signed)
Trial of astepro 2 sprays each nostril qhs.  Went over how to administer this med correctly today.

## 2013-05-08 NOTE — Assessment & Plan Note (Signed)
Extensive discussion today. Decided to continue with TLC, recheck FLP in 47mo (CPE at that time). Encouraged gradual decrease in beer consumption plus gradual increase in elliptical machine use--push HR to 160 or so for 30 min 5d per week.

## 2013-05-08 NOTE — Assessment & Plan Note (Signed)
Right mid forearm extensor region. I can barely palpate anything.  Likely a cystic structure. Reassured.  Watchful waiting approach.

## 2013-05-08 NOTE — Progress Notes (Signed)
OFFICE NOTE  05/08/2013  CC:  Chief Complaint  Patient presents with  . Follow-up    6 month     HPI: Patient is a 52 y.o. Caucasian male who is here for 6 mo f/u hyperlipidemia. Repeat lipids 04/24/13 were a bit worse than 6 mo ago and he is at the point of needing to make a choice to go with cholest-lowering med or stick with TLC.  He is choosing TLC.  He has lost 7 lbs since last visit.   Still drinks 12-18 beers per week, many of these are the dark/heavy/high alc% variety.  Does elliptical some and walks some.    Right forearm with small nodule he feels last few months.  No pain.  No skin changes.  It is not changing in size.  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 Years ago was put on zyrtec 10mg  qhs for this.  Says the last 10 mo or so his nose gets stuffy and he can't sleep well without breathing through his mouth.  Took saline prior to going to bed and still had the same problem.  Afrin helps but he knows this is only a short term solution.  Pertinent PMH:  Past Medical History  Diagnosis Date  . Pilonidal cyst with abscess 07/15/2011    Qualifier: Diagnosis of  By: Thurmond Butts MD, Dennard Nip    . 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  Followed by Dr. Everardo All.   No changes on u/s f/u 09/2012  . 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  MEDS:  Outpatient Prescriptions Prior to Visit  Medication Sig Dispense Refill  . cetirizine (ZYRTEC) 10 MG tablet Take 10 mg by mouth daily.       No facility-administered medications prior to visit.    PE: Blood pressure 126/74, pulse 54, temperature 97.8 F (36.6 C), temperature source Temporal, resp. rate 16, height 6' (1.829 m), weight 228 lb (103.42 kg), SpO2 99.00%. Gen: Alert, well appearing.  Patient is oriented to  person, place, time, and situation. AFFECT: pleasant, lucid thought and speech. Right forearm: I can barely palpate a tiny, rubbery-like subQ nodule deep in the extensor muscle mechanism.  No tenderness.  No skin changes in the area.   No further exam today.  IMPRESSION AND PLAN:  Hyperlipidemia Extensive discussion today. Decided to continue with TLC, recheck FLP in 7mo (CPE at that time). Encouraged gradual decrease in beer consumption plus gradual increase in elliptical machine use--push HR to 160 or so for 30 min 5d per week.  Subcutaneous nodule Right mid forearm extensor region. I can barely palpate anything.  Likely a cystic structure. Reassured.  Watchful waiting approach.  Vasomotor rhinitis Trial of astepro 2 sprays each nostril qhs.  Went over how to administer this med correctly today.   An After Visit Summary was printed and given to the patient.  Spent 25 min with pt today, with >50% of this time spent in counseling and care coordination regarding the above problems.  FOLLOW UP: 7mo for CPE--fasting labs the week prior (ordered).

## 2013-05-16 ENCOUNTER — Ambulatory Visit (INDEPENDENT_AMBULATORY_CARE_PROVIDER_SITE_OTHER)
Admission: RE | Admit: 2013-05-16 | Discharge: 2013-05-16 | Disposition: A | Discharge status: Home / Self Care | Payer: 59 | Source: Ambulatory Visit | Attending: Family Medicine | Admitting: Family Medicine

## 2013-05-16 ENCOUNTER — Ambulatory Visit (INDEPENDENT_AMBULATORY_CARE_PROVIDER_SITE_OTHER): Payer: 59 | Admitting: Family Medicine

## 2013-05-16 ENCOUNTER — Ambulatory Visit
Admission: RE | Admit: 2013-05-16 | Discharge: 2013-05-16 | Disposition: A | Discharge status: Home / Self Care | Payer: 59 | Source: Ambulatory Visit | Attending: Family Medicine | Admitting: Family Medicine

## 2013-05-16 ENCOUNTER — Encounter: Payer: Self-pay | Admitting: Family Medicine

## 2013-05-16 VITALS — BP 135/76 | HR 52 | Temp 97.6°F | Resp 16 | Ht 72.0 in | Wt 230.0 lb

## 2013-05-16 DIAGNOSIS — R310 Gross hematuria: Secondary | ICD-10-CM

## 2013-05-16 DIAGNOSIS — N410 Acute prostatitis: Secondary | ICD-10-CM

## 2013-05-16 DIAGNOSIS — Z8052 Family history of malignant neoplasm of bladder: Secondary | ICD-10-CM

## 2013-05-16 DIAGNOSIS — N200 Calculus of kidney: Secondary | ICD-10-CM

## 2013-05-16 LAB — POCT URINALYSIS DIPSTICK
Glucose, UA: NEGATIVE
Nitrite, UA: NEGATIVE
Protein, UA: 30
Urobilinogen, UA: 0.2

## 2013-05-16 MED ORDER — CIPROFLOXACIN HCL 500 MG PO TABS: ORAL_TABLET | ORAL | Status: DC

## 2013-05-16 NOTE — Progress Notes (Signed)
OFFICE NOTE  05/16/2013  CC:  Chief Complaint  Patient presents with  . Hematuria     HPI: Patient is a 52 y.o. Caucasian male who is here for seeing blood in his urine.  "Burnt red" appearance to urine starting yesterday morning.  Drank lots of fluids and it gradually cleared until he had another episode of gross hematuria today.  No dysuria, no urinary urgency.  No flank pain.  He has had a mild pubic area pressure sensation, also had a brief period in which a testicle ached a little but nothing persistent.  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  No other potential pressure/trauma to pubic/prostate region.   Denies pain in flank or abdomen or sides.   He has never had gross hematuria before, has never had a prostate infection before.  Prostate exam at his CPE was normal, PSA at that time was normal as well (10/18/12). His father had bladder cancer (he was a smoker).   Pertinent PMH:  Past Medical History  Diagnosis Date  . Pilonidal cyst with abscess 07/15/2011    Qualifier: Diagnosis of  By: Thurmond Butts MD, Dennard Nip    . 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  Followed by Dr. Everardo All.   No changes on u/s f/u 09/2012  . 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 Azelastine HCl (ASTEPRO) 0.15 % SOLN 2 sprays in each nostril at bedtime each night  30 mL  11  . cetirizine (ZYRTEC) 10 MG tablet Take 10 mg by mouth daily.       No facility-administered medications prior to visit.    PE: Blood pressure 135/76, pulse 52, temperature 97.6 F (36.4 C), temperature source Oral, resp. rate 16, height 6' (1.829 m), weight 230 lb  (104.327 kg), SpO2 98.00%. Gen: Alert, well appearing.  Patient is oriented to person, place, time, and situation. DRE: normal tone, no mass.  Prostate normal size and without nodule, induration, or asymmetry.  No prostate tenderness.  LAB: CC UA today showed large blood, 30 mg/dl protein, otherwise normal.  IMPRESSION AND PLAN:  Gross hematuria Early in the course, only mild prostate region symptom present at this time, prostate exam normal. Hx of non-obstructing right renal calculus noted incidentally on L-spine film 2010, without any history of obstructive stone/renal colic. Lastly, he does have FH of bladder cancer (father).  Patient has no other RF's for bladder cancer.  Given the early course of things, we may just have to wait to see if any significant symptoms develop to help better determine the etiology of his hematuria. At this point, though, I plan on empiric cipro 500 bid x 14d for early/mild prostatitis.  Send urine for c/s. Obtain KUB in office today. Refer to urology since his clinical presentation is not strongly convincing of prostatitis and he may need consideration of further w/u such as cystoscopy and renal u/s or CT.    An After Visit Summary was printed and given to the patient.  FOLLOW UP: prn

## 2013-05-16 NOTE — Assessment & Plan Note (Signed)
Early in the course, only mild prostate region symptom present at this time, prostate exam normal. Hx of non-obstructing right renal calculus noted incidentally on L-spine film 2010, without any history of obstructive stone/renal colic. Lastly, he does have FH of bladder cancer (father).  Patient has no other RF's for bladder cancer.  Given the early course of things, we may just have to wait to see if any significant symptoms develop to help better determine the etiology of his hematuria. At this point, though, I plan on empiric cipro 500 bid x 14d for early/mild prostatitis.  Send urine for c/s. Obtain KUB in office today. Refer to urology since his clinical presentation is not strongly convincing of prostatitis and he may need consideration of further w/u such as cystoscopy and renal u/s or CT.

## 2013-05-18 LAB — URINE CULTURE

## 2013-05-30 ENCOUNTER — Encounter (HOSPITAL_COMMUNITY): Payer: Self-pay

## 2013-05-30 ENCOUNTER — Ambulatory Visit (HOSPITAL_COMMUNITY)
Admission: RE | Admit: 2013-05-30 | Discharge: 2013-05-30 | Disposition: A | Discharge status: Home / Self Care | Payer: 59 | Source: Ambulatory Visit | Attending: Urology | Admitting: Urology

## 2013-05-30 ENCOUNTER — Other Ambulatory Visit: Payer: Self-pay | Admitting: Urology

## 2013-05-30 DIAGNOSIS — N200 Calculus of kidney: Secondary | ICD-10-CM | POA: Insufficient documentation

## 2013-05-30 DIAGNOSIS — R310 Gross hematuria: Secondary | ICD-10-CM

## 2013-05-30 DIAGNOSIS — K4090 Unilateral inguinal hernia, without obstruction or gangrene, not specified as recurrent: Secondary | ICD-10-CM | POA: Insufficient documentation

## 2013-05-30 DIAGNOSIS — N281 Cyst of kidney, acquired: Secondary | ICD-10-CM | POA: Insufficient documentation

## 2013-05-30 DIAGNOSIS — K4091 Unilateral inguinal hernia, without obstruction or gangrene, recurrent: Secondary | ICD-10-CM

## 2013-05-30 HISTORY — DX: Unilateral inguinal hernia, without obstruction or gangrene, recurrent: K40.91

## 2013-05-30 MED ORDER — IOHEXOL 300 MG/ML  SOLN
100.0000 mL | Freq: Once | INTRAMUSCULAR | Status: AC | PRN
  Administered 2013-05-30: 100 mL via INTRAVENOUS

## 2013-05-31 ENCOUNTER — Encounter: Payer: Self-pay | Admitting: Family Medicine

## 2013-06-04 ENCOUNTER — Other Ambulatory Visit: Payer: Self-pay | Admitting: Urology

## 2013-06-04 DIAGNOSIS — N200 Calculus of kidney: Secondary | ICD-10-CM

## 2013-06-10 DIAGNOSIS — R310 Gross hematuria: Secondary | ICD-10-CM

## 2013-06-10 HISTORY — DX: Gross hematuria: R31.0

## 2013-07-09 ENCOUNTER — Ambulatory Visit (HOSPITAL_COMMUNITY)
Admission: RE | Admit: 2013-07-09 | Discharge: 2013-07-09 | Disposition: A | Discharge status: Home / Self Care | Payer: 59 | Source: Ambulatory Visit | Attending: Urology | Admitting: Urology

## 2013-07-09 DIAGNOSIS — N200 Calculus of kidney: Secondary | ICD-10-CM | POA: Insufficient documentation

## 2013-08-15 ENCOUNTER — Other Ambulatory Visit: Payer: Self-pay

## 2013-08-30 ENCOUNTER — Encounter: Payer: Self-pay | Admitting: Family Medicine

## 2013-09-17 ENCOUNTER — Encounter: Payer: Self-pay | Admitting: Gastroenterology

## 2013-09-17 ENCOUNTER — Other Ambulatory Visit: Payer: Self-pay | Admitting: Urology

## 2013-09-17 DIAGNOSIS — N200 Calculus of kidney: Secondary | ICD-10-CM

## 2013-10-21 ENCOUNTER — Ambulatory Visit (HOSPITAL_COMMUNITY)
Admission: RE | Admit: 2013-10-21 | Discharge: 2013-10-21 | Disposition: A | Discharge status: Home / Self Care | Payer: 59 | Source: Ambulatory Visit | Attending: Urology | Admitting: Urology

## 2013-10-21 DIAGNOSIS — R9389 Abnormal findings on diagnostic imaging of other specified body structures: Secondary | ICD-10-CM | POA: Insufficient documentation

## 2013-10-21 DIAGNOSIS — N200 Calculus of kidney: Secondary | ICD-10-CM

## 2013-10-24 ENCOUNTER — Encounter: Payer: Self-pay | Admitting: Family Medicine

## 2013-10-31 ENCOUNTER — Other Ambulatory Visit (INDEPENDENT_AMBULATORY_CARE_PROVIDER_SITE_OTHER): Payer: 59

## 2013-10-31 ENCOUNTER — Other Ambulatory Visit: Payer: 59

## 2013-10-31 DIAGNOSIS — Z0000 Encounter for general adult medical examination without abnormal findings: Secondary | ICD-10-CM

## 2013-10-31 DIAGNOSIS — E785 Hyperlipidemia, unspecified: Secondary | ICD-10-CM

## 2013-10-31 LAB — COMPREHENSIVE METABOLIC PANEL
ALK PHOS: 63 U/L (ref 39–117)
ALT: 13 U/L (ref 0–53)
AST: 26 U/L (ref 0–37)
Albumin: 4.3 g/dL (ref 3.5–5.2)
BUN: 14 mg/dL (ref 6–23)
CALCIUM: 9.3 mg/dL (ref 8.4–10.5)
CO2: 26 mEq/L (ref 19–32)
CREATININE: 0.9 mg/dL (ref 0.4–1.5)
Chloride: 105 mEq/L (ref 96–112)
GFR: 96.92 mL/min (ref >60.00)
Glucose, Bld: 91 mg/dL (ref 70–99)
Potassium: 4.2 mEq/L (ref 3.5–5.1)
Sodium: 139 mEq/L (ref 135–145)
Total Bilirubin: 1.1 mg/dL (ref 0.3–1.2)
Total Protein: 7.6 g/dL (ref 6.0–8.3)

## 2013-10-31 LAB — CBC WITH DIFFERENTIAL/PLATELET
BASOS ABS: 0 10*3/uL (ref 0.0–0.1)
Basophils Relative: 0.6 % (ref 0.0–3.0)
Eosinophils Absolute: 0.1 10*3/uL (ref 0.0–0.7)
Eosinophils Relative: 2.4 % (ref 0.0–5.0)
HCT: 44.4 % (ref 39.0–52.0)
Hemoglobin: 15.1 g/dL (ref 13.0–17.0)
LYMPHS PCT: 31.5 % (ref 12.0–46.0)
Lymphs Abs: 1.9 10*3/uL (ref 0.7–4.0)
MCHC: 34 g/dL (ref 30.0–36.0)
MCV: 89 fl (ref 78.0–100.0)
MONOS PCT: 8.4 % (ref 3.0–12.0)
Monocytes Absolute: 0.5 10*3/uL (ref 0.1–1.0)
NEUTROS PCT: 57.1 % (ref 43.0–77.0)
Neutro Abs: 3.4 10*3/uL (ref 1.4–7.7)
PLATELETS: 198 10*3/uL (ref 150.0–400.0)
RBC: 4.98 Mil/uL (ref 4.22–5.81)
RDW: 12.6 % (ref 11.5–14.6)
WBC: 6 10*3/uL (ref 4.5–10.5)

## 2013-10-31 LAB — LIPID PANEL
CHOLESTEROL: 206 mg/dL — AB (ref 0–200)
HDL: 37.3 mg/dL — AB (ref >39.00)
Total CHOL/HDL Ratio: 6
Triglycerides: 118 mg/dL (ref 0.0–149.0)
VLDL: 23.6 mg/dL (ref 0.0–40.0)

## 2013-11-01 LAB — LDL CHOLESTEROL, DIRECT: LDL DIRECT: 151.4 mg/dL

## 2013-11-04 ENCOUNTER — Ambulatory Visit (AMBULATORY_SURGERY_CENTER): Payer: Self-pay | Admitting: *Deleted

## 2013-11-04 VITALS — Ht 72.0 in | Wt 230.8 lb

## 2013-11-04 DIAGNOSIS — Z1211 Encounter for screening for malignant neoplasm of colon: Secondary | ICD-10-CM

## 2013-11-04 MED ORDER — MOVIPREP 100 G PO SOLR: ORAL | Status: DC

## 2013-11-04 NOTE — Progress Notes (Signed)
Patient denies any allergies to eggs or soy. Patient denies any problems with anesthesia.  

## 2013-11-07 ENCOUNTER — Ambulatory Visit (INDEPENDENT_AMBULATORY_CARE_PROVIDER_SITE_OTHER): Payer: 59 | Admitting: Family Medicine

## 2013-11-07 ENCOUNTER — Other Ambulatory Visit: Payer: Self-pay | Admitting: Family Medicine

## 2013-11-07 ENCOUNTER — Encounter: Payer: Self-pay | Admitting: Family Medicine

## 2013-11-07 VITALS — BP 119/74 | HR 62 | Temp 98.0°F | Resp 18 | Ht 72.0 in | Wt 228.0 lb

## 2013-11-07 DIAGNOSIS — Z0000 Encounter for general adult medical examination without abnormal findings: Secondary | ICD-10-CM

## 2013-11-07 DIAGNOSIS — Z0389 Encounter for observation for other suspected diseases and conditions ruled out: Secondary | ICD-10-CM

## 2013-11-07 DIAGNOSIS — J310 Chronic rhinitis: Secondary | ICD-10-CM

## 2013-11-07 DIAGNOSIS — E049 Nontoxic goiter, unspecified: Secondary | ICD-10-CM

## 2013-11-07 LAB — PSA: PSA: 0.59 ng/mL (ref <=4.00)

## 2013-11-07 MED ORDER — FLUTICASONE PROPIONATE 50 MCG/ACT NA SUSP: NASAL | Status: DC

## 2013-11-07 NOTE — Progress Notes (Signed)
Office Note 11/07/2013  CC:  Chief Complaint  Patient presents with  . Annual Exam    HPI:  Gary Collins is a 53 y.o. White male who is here for CPE.   Acute c/o: nasal congestion, chronic and helped some by astepro use but the last 6 wks he notes that the only way he can get sleep at night is to use afrin.  He uses this for a few nights in a row and then stops it for a while. No fevers, no facial pain, no significant coughing.  No HAs.  Reviewed recent labs from 10/31/13 in detail today: all good, cholesterol stable. Needs PSA still for this year (last was 10/2012).  Past Medical History  Diagnosis Date  . Pilonidal cyst with abscess 07/15/2011    Qualifier: Diagnosis of  By: Alveta Heimlich MD, Cornelia Copa    . 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  Followed by Dr. Loanne Drilling.   No changes on u/s f/u 09/2012  . 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 Nephrolithiasis 05/30/2013    7 mm nonobstructing right mid kidney calculus--uric acid stone (started on Urocit K by urol)  . Inguinal hernia recurrent unilateral 05/30/13    Small, right sided, containing adipose tissue (noted on CT scan for hematuria)  . Renal cyst 05/30/13    X 2, small, stable and benign-appearing (on CT scan for hematuria)  . Gross hematuria 06/2013    Right kidney radiolucent stone believed to be uric acid stone, cysto normal: started on urocit K 15 mEQ bid by urology    Past Surgical History  Procedure Laterality Date  . Knee arthroscopy      left  . Biopsy thyroid      Family History  Problem Relation Age of Onset  . Cancer Father     bladder  . Colon cancer Neg Hx     History   Social History  . Marital Status: Married    Spouse Name: [name]    Number of Children: [name]  . Years of Education: [name]   Occupational History  . Not on file.   Social History Main Topics  . Smoking status: Never Smoker    . Smokeless tobacco: Never Used  . Alcohol Use: 4.8 oz/week    8 Cans of beer per week  . Drug Use: No  . Sexual Activity: Not on file   Other Topics Concern  . Not on file   Social History Narrative   Married, 52 y/o girl and 28 boy.   Occupation: Clinical biochemist (residential).   Orig from [place], [place].   Enjoys fishing.  Exercises 2-3 times per week: 30 min elliptical.  Also plays soccer.   No tob, occ beer, no drugs.          Outpatient Prescriptions Prior to Visit  Medication Sig Dispense Refill  . Azelastine HCl (ASTEPRO) 0.15 % SOLN 2 sprays in each nostril at bedtime each night  30 mL  11  . cetirizine (ZYRTEC) 10 MG tablet Take 10 mg by mouth daily.      . Omega-3 Fatty Acids (FISH OIL PO) Take 4 tablets by mouth daily.      . potassium chloride (K-DUR) 10 MEQ tablet Take 10 mEq by mouth 3 (three) times daily.      Marland Kitchen MOVIPREP 100 G SOLR Take as directed  1 kit  0   No facility-administered medications prior to visit.    Allergies  Allergen Reactions  . Beta Adrenergic Blockers Other (See Comments)    Not good for me per dr.  . Ibuprofen     REACTION: lip swelling    ROS Review of Systems  Constitutional: Negative for fever, chills, appetite change and fatigue.  HENT: Negative for congestion, dental problem, ear pain and sore throat.   Eyes: Negative for discharge, redness and visual disturbance.  Respiratory: Negative for cough, chest tightness, shortness of breath and wheezing.   Cardiovascular: Negative for chest pain, palpitations and leg swelling.  Gastrointestinal: Negative for nausea, vomiting, abdominal pain, diarrhea and blood in stool.  Genitourinary: Negative for dysuria, urgency, frequency, hematuria, flank pain and difficulty urinating.  Musculoskeletal: Negative for arthralgias, back pain, joint swelling, myalgias and neck stiffness.  Skin: Negative for pallor and rash.  Neurological: Negative for dizziness, speech difficulty, weakness and  headaches.  Hematological: Negative for adenopathy. Does not bruise/bleed easily.  Psychiatric/Behavioral: Negative for confusion and sleep disturbance. The patient is not nervous/anxious.      PE; Blood pressure 119/74, pulse 62, temperature 98 F (36.7 C), temperature source Temporal, resp. rate 18, height 6' (1.829 m), weight 228 lb (103.42 kg), SpO2 99.00%. Gen: Alert, well appearing.  Patient is oriented to person, place, time, and situation. AFFECT: pleasant, lucid thought and speech. ENT: Ears: EACs clear, normal epithelium.  TMs with good light reflex and landmarks bilaterally.  Eyes: no injection, icteris, swelling, or exudate.  EOMI, PERRLA. Nose: no drainage or turbinate edema/swelling.  No injection or focal lesion.  Mouth: lips without lesion/swelling.  Oral mucosa pink and moist.  Dentition intact and without obvious caries or gingival swelling.  Oropharynx without erythema, exudate, or swelling.  Neck: supple/nontender.  No LAD, mass, or TM.  Carotid pulses 2+ bilaterally, without bruits. CV: RRR, no m/r/g.   LUNGS: CTA bilat, nonlabored resps, good aeration in all lung fields. ABD: soft, NT, ND, BS normal.  No hepatospenomegaly or mass.  No bruits. EXT: no clubbing, cyanosis, or edema.  Musculoskeletal: no joint swelling, erythema, warmth, or tenderness.  ROM of all joints intact. Skin - no sores or suspicious lesions or rashes or color changes Rectal exam: negative without mass, lesions or tenderness, PROSTATE EXAM: smooth and symmetric without nodules or tenderness.   Pertinent labs:  Lab Results  Component Value Date   TSH 1.05 10/18/2012   Lab Results  Component Value Date   WBC 6.0 10/31/2013   HGB 15.1 10/31/2013   HCT 44.4 10/31/2013   MCV 89.0 10/31/2013   PLT 198.0 10/31/2013   Lab Results  Component Value Date   CREATININE 0.9 10/31/2013   BUN 14 10/31/2013   NA 139 10/31/2013   K 4.2 10/31/2013   CL 105 10/31/2013   CO2 26 10/31/2013   Lab Results  Component  Value Date   ALT 13 10/31/2013   AST 26 10/31/2013   ALKPHOS 63 10/31/2013   BILITOT 1.1 10/31/2013   Lab Results  Component Value Date   CHOL 206* 10/31/2013   Lab Results  Component Value Date   HDL 37.30* 10/31/2013   No results found for this basename: [facility]   Lab Results  Component Value Date   TRIG 118.0 10/31/2013   Lab Results  Component Value Date   CHOLHDL 6 10/31/2013   Lab Results  Component Value Date   PSA 0.94 10/18/2012    ASSESSMENT AND PLAN:   Chronic rhinitis  Not ideal control.  Try not to use afrin. Continue astepro hs. Start flonase 2 sprays each nostril every morning.  Health maintenance examination Reviewed age and gender appropriate health maintenance issues (prudent diet, regular exercise, health risks of tobacco and excessive alcohol, use of seatbelts, fire alarms in home, use of sunscreen).  Also reviewed age and gender appropriate health screening as well as vaccine recommendations. He is scheduled for his first screening colonoscopy 11/18/13. DRE today normal.  PSA to be added to last week's labs. Health panel labs reviewed with pt today: these were good.  Plan to repeat in 1 yr.   An After Visit Summary was printed and given to the patient.  FOLLOW UP:  Return in about 1 year (around 11/07/2014) for CPE with fasting labs.

## 2013-11-07 NOTE — Assessment & Plan Note (Signed)
Not ideal control.  Try not to use afrin. Continue astepro hs. Start flonase 2 sprays each nostril every morning.

## 2013-11-07 NOTE — Assessment & Plan Note (Signed)
Reviewed age and gender appropriate health maintenance issues (prudent diet, regular exercise, health risks of tobacco and excessive alcohol, use of seatbelts, fire alarms in home, use of sunscreen).  Also reviewed age and gender appropriate health screening as well as vaccine recommendations. He is scheduled for his first screening colonoscopy 11/18/13. DRE today normal.  PSA to be added to last week's labs. Health panel labs reviewed with pt today: these were good.  Plan to repeat in 1 yr.

## 2013-11-07 NOTE — Progress Notes (Signed)
Pre visit review using our clinic review tool, if applicable. No additional management support is needed unless otherwise documented below in the visit note. 

## 2013-11-18 ENCOUNTER — Encounter: Payer: Self-pay | Admitting: Gastroenterology

## 2013-11-18 ENCOUNTER — Ambulatory Visit (AMBULATORY_SURGERY_CENTER): Payer: 59 | Admitting: Gastroenterology

## 2013-11-18 VITALS — BP 122/66 | HR 53 | Temp 97.6°F | Resp 11 | Ht 74.0 in | Wt 230.0 lb

## 2013-11-18 DIAGNOSIS — Z1211 Encounter for screening for malignant neoplasm of colon: Secondary | ICD-10-CM

## 2013-11-18 DIAGNOSIS — D126 Benign neoplasm of colon, unspecified: Secondary | ICD-10-CM

## 2013-11-18 HISTORY — DX: Benign neoplasm of colon, unspecified: D12.6

## 2013-11-18 MED ORDER — SODIUM CHLORIDE 0.9 % IV SOLN: 500.0000 mL | INTRAVENOUS | Status: DC

## 2013-11-18 NOTE — Patient Instructions (Signed)
Discharge instructions given with verbal understanding. Handout on polyps. Resume previous medications. YOU HAD AN ENDOSCOPIC PROCEDURE TODAY AT THE [facility]: Refer to the procedure report that was given to you for any specific questions about what was found during the examination.  If the procedure report does not answer your questions, please call your gastroenterologist to clarify.  If you requested that your care partner not be given the details of your procedure findings, then the procedure report has been included in a sealed envelope for you to review at your convenience later.  YOU SHOULD EXPECT: Some feelings of bloating in the abdomen. Passage of more gas than usual.  Walking can help get rid of the air that was put into your GI tract during the procedure and reduce the bloating. If you had a lower endoscopy (such as a colonoscopy or flexible sigmoidoscopy) you may notice spotting of blood in your stool or on the toilet paper. If you underwent a bowel prep for your procedure, then you may not have a normal bowel movement for a few days.  DIET: Your first meal following the procedure should be a light meal and then it is ok to progress to your normal diet.  A half-sandwich or bowl of soup is an example of a good first meal.  Heavy or fried foods are harder to digest and may make you feel nauseous or bloated.  Likewise meals heavy in dairy and vegetables can cause extra gas to form and this can also increase the bloating.  Drink plenty of fluids but you should avoid alcoholic beverages for 24 hours.  ACTIVITY: Your care partner should take you home directly after the procedure.  You should plan to take it easy, moving slowly for the rest of the day.  You can resume normal activity the day after the procedure however you should NOT DRIVE or use heavy machinery for 24 hours (because of the sedation medicines used during the test).    SYMPTOMS TO REPORT IMMEDIATELY: A  gastroenterologist can be reached at any hour.  During normal business hours, 8:30 AM to 5:00 PM Monday through Friday, call [phone].  After hours and on weekends, please call the GI answering service at [phone] who will take a message and have the physician on call contact you.   Following lower endoscopy (colonoscopy or flexible sigmoidoscopy):  Excessive amounts of blood in the stool  Significant tenderness or worsening of abdominal pains  Swelling of the abdomen that is new, acute  Fever of 100F or higher  FOLLOW UP: If any biopsies were taken you will be contacted by phone or by letter within the next 1-3 weeks.  Call your gastroenterologist if you have not heard about the biopsies in 3 weeks.  Our staff will call the home number listed on your records the next business day following your procedure to check on you and address any questions or concerns that you may have at that time regarding the information given to you following your procedure. This is a courtesy call and so if there is no answer at the home number and we have not heard from you through the emergency physician on call, we will assume that you have returned to your regular daily activities without incident.  SIGNATURES/CONFIDENTIALITY: You and/or your care partner have signed paperwork which will be entered into your electronic medical record.  These signatures attest to the fact that that the information above on your After Visit Summary has been   reviewed and is understood.  Full responsibility of the confidentiality of this discharge information lies with you and/or your care-partner. 

## 2013-11-18 NOTE — Op Note (Signed)
[place]  [address] [place], [id number]   COLONOSCOPY PROCEDURE REPORT  PATIENT: Gary Collins, Current  MR#: [id number] BIRTHDATE: Aug 21, 1961 , 52  yrs. old GENDER: Male ENDOSCOPIST: Milus Banister, MD REFERRED WE:[id number] Anitra Lauth, M.D. PROCEDURE DATE:  11/18/2013 PROCEDURE:   Colonoscopy with snare polypectomy First Screening Colonoscopy - Avg.  risk and is 50 yrs.  old or older Yes.  Prior Negative Screening - Now for repeat screening. [name]  History of Adenoma - Now for follow-up colonoscopy & has been > or = to 3 yrs.  [name]  Polyps Removed Today? Yes. ASA CLASS:   Class II INDICATIONS:average risk screening. MEDICATIONS: Fentanyl 50 mcg IV, Versed 6 mg IV, and These medications were titrated to patient response per physician's verbal order  DESCRIPTION OF PROCEDURE:   After the risks benefits and alternatives of the procedure were thoroughly explained, informed consent was obtained.  A digital rectal exam revealed no abnormalities of the rectum.   The LB [id number]  endoscope was introduced through the anus and advanced to the cecum, which was identified by both the appendix and ileocecal valve. No adverse events experienced.   The quality of the prep was excellent.  The instrument was then slowly withdrawn as the colon was fully examined.  COLON FINDINGS: Three small polyps were found, removed and all were sent to pathology.  These were sessile; located in cecum, sigmoid segments; ranged in size from 40mm to 4mm; were removedw with cold snare.  The examination was otherwise normal.  Retroflexed views revealed no abnormalities. The time to cecum=1 minutes 59 seconds. Withdrawal time=11 minutes 07 seconds.  The scope was withdrawn and the procedure completed. COMPLICATIONS: There were no complications.  ENDOSCOPIC IMPRESSION: Three small polyps were found, removed and all were sent to pathology. The examination was otherwise  normal.  RECOMMENDATIONS: If the polyp(s) removed today are proven to be adenomatous (pre-cancerous) polyps, you will need a colonoscopy in 3-5 years. Otherwise you should continue to follow colorectal cancer screening guidelines for "routine risk" patients with a colonoscopy in 10 years.  You will receive a letter within 1-2 weeks with the results of your biopsy as well as final recommendations.  Please call my office if you have not received a letter after 3 weeks.   eSigned:  Milus Banister, MD 11/18/2013 8:55 AM

## 2013-11-19 ENCOUNTER — Telehealth: Payer: Self-pay | Admitting: *Deleted

## 2013-11-19 NOTE — Telephone Encounter (Signed)
  Follow up Call-  Call back number 11/18/2013  Post procedure Call Back phone  # [phone]  Permission to leave phone message Yes     Patient questions:  Do you have a fever, pain , or abdominal swelling? no Pain Score  0 *  Have you tolerated food without any problems? yes  Have you been able to return to your normal activities? yes  Do you have any questions about your discharge instructions: Diet   no Medications  no Follow up visit  no  Do you have questions or concerns about your Care? no  Actions: * If pain score is 4 or above: No action needed, pain <4.

## 2013-11-22 ENCOUNTER — Encounter: Payer: Self-pay | Admitting: Gastroenterology

## 2013-12-27 ENCOUNTER — Other Ambulatory Visit: Payer: Self-pay | Admitting: Urology

## 2013-12-27 DIAGNOSIS — N1330 Unspecified hydronephrosis: Secondary | ICD-10-CM

## 2014-01-23 ENCOUNTER — Ambulatory Visit (HOSPITAL_COMMUNITY)
Admission: RE | Admit: 2014-01-23 | Discharge: 2014-01-23 | Disposition: A | Discharge status: Home / Self Care | Payer: 59 | Source: Ambulatory Visit | Attending: Urology | Admitting: Urology

## 2014-01-23 DIAGNOSIS — N281 Cyst of kidney, acquired: Secondary | ICD-10-CM | POA: Insufficient documentation

## 2014-01-23 DIAGNOSIS — N200 Calculus of kidney: Secondary | ICD-10-CM | POA: Insufficient documentation

## 2014-01-23 DIAGNOSIS — R3129 Other microscopic hematuria: Secondary | ICD-10-CM | POA: Insufficient documentation

## 2014-01-23 DIAGNOSIS — N1330 Unspecified hydronephrosis: Secondary | ICD-10-CM

## 2014-02-04 ENCOUNTER — Encounter: Payer: Self-pay | Admitting: Family Medicine

## 2014-07-03 ENCOUNTER — Other Ambulatory Visit: Payer: Self-pay | Admitting: Urology

## 2014-07-03 DIAGNOSIS — N200 Calculus of kidney: Secondary | ICD-10-CM

## 2014-07-25 ENCOUNTER — Other Ambulatory Visit: Payer: Self-pay

## 2014-07-30 ENCOUNTER — Ambulatory Visit (HOSPITAL_COMMUNITY)
Admission: RE | Admit: 2014-07-30 | Discharge: 2014-07-30 | Disposition: A | Discharge status: Home / Self Care | Payer: 59 | Source: Ambulatory Visit | Attending: Urology | Admitting: Urology

## 2014-07-30 DIAGNOSIS — N200 Calculus of kidney: Secondary | ICD-10-CM

## 2014-08-11 ENCOUNTER — Encounter: Payer: Self-pay | Admitting: Family Medicine

## 2014-10-24 ENCOUNTER — Other Ambulatory Visit: Payer: Self-pay | Admitting: Family Medicine

## 2014-12-04 ENCOUNTER — Other Ambulatory Visit: Payer: 59

## 2014-12-11 ENCOUNTER — Encounter: Payer: 59 | Admitting: Family Medicine

## 2014-12-31 IMAGING — CR DG ABDOMEN 1V
1 series · 1 of 1 positions shown · non-contrast
Comparison: Lumbar spine radiographs 11/13/2008

CLINICAL DATA: Gross hematuria, history of right renal calculus on
prior lumbar radiographs

ABDOMEN - 1 VIEW

[view not recorded]
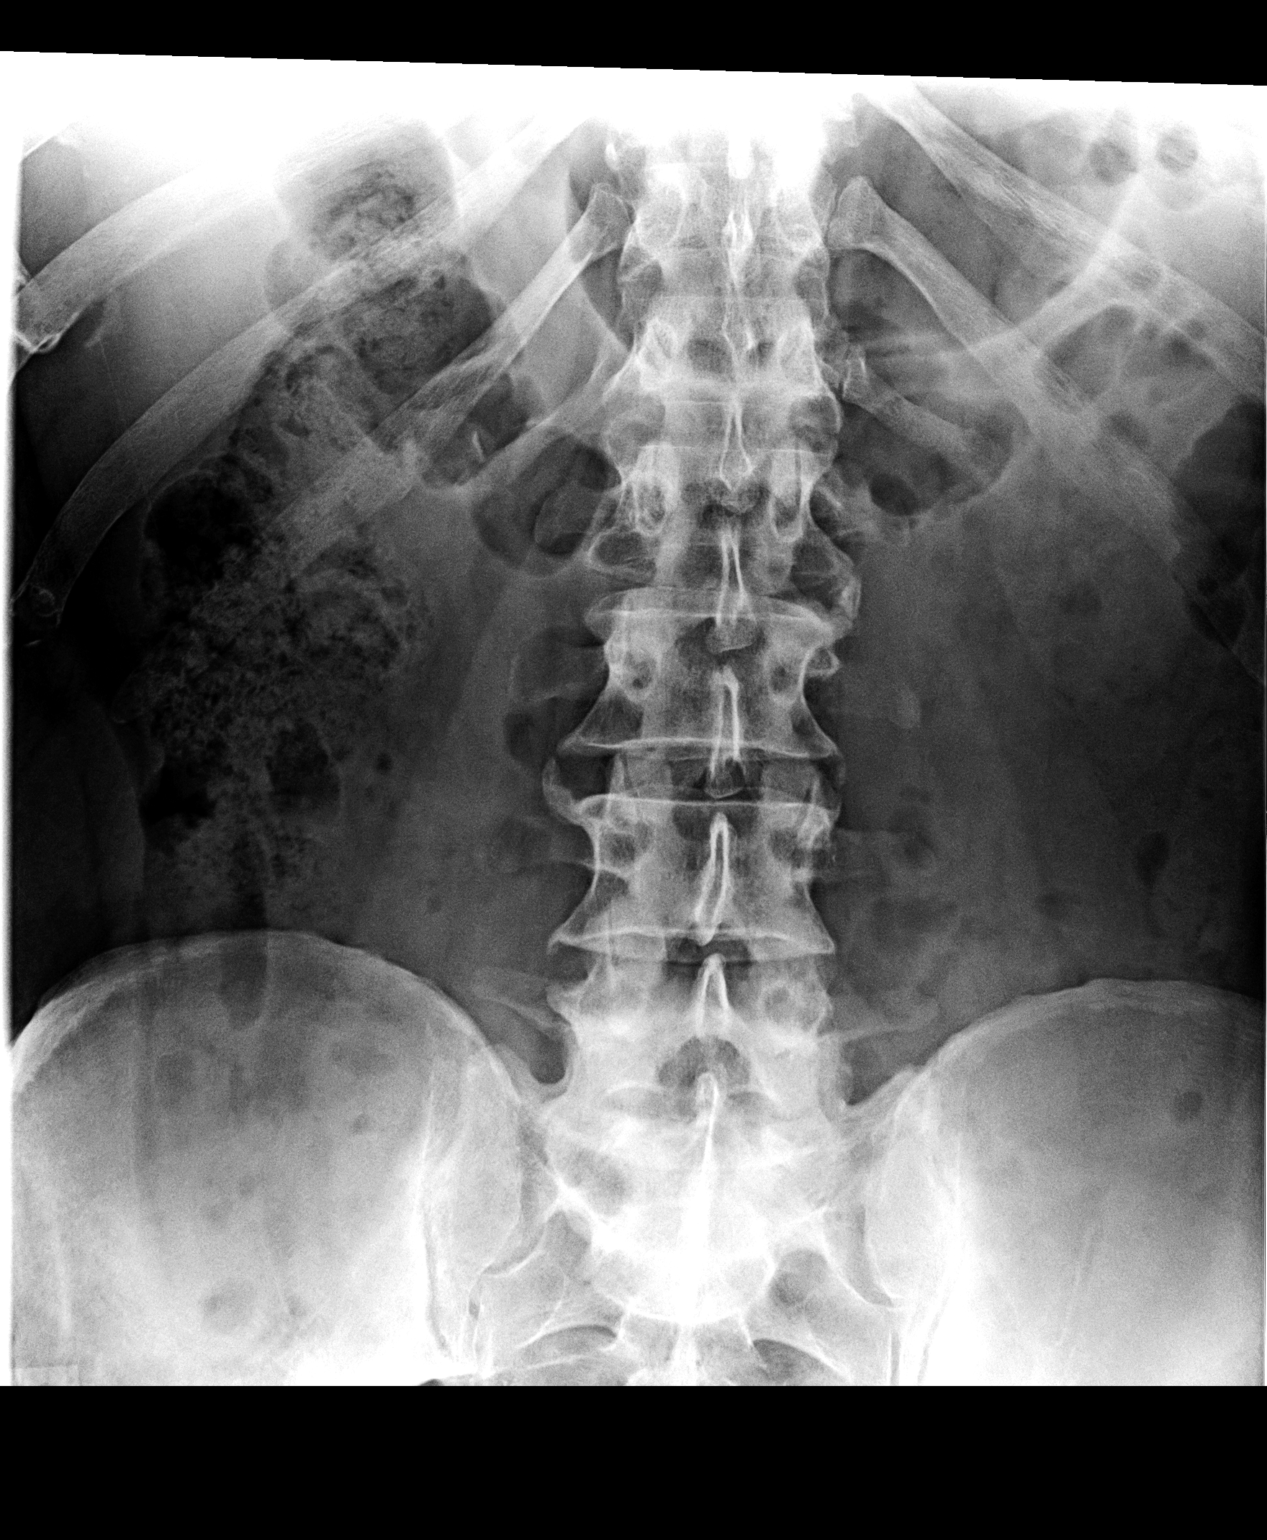

[1 of 1 positions shown; findings below may reference images not displayed]

FINDINGS: Hypoplastic last rib pair with four non-rib bearing lumbar type
vertebrae and scattered degenerative disc disease changes.
Linear calcification projects over right kidney question renal
calculus 8 x 2 mm versus vascular calcification, unchanged from
previous exam.
No additional urinary tract calcifications are definitely
visualized.
No acute osseous findings.
Bowel gas pattern normal.
IMPRESSION: 8 x 2 mm diameter calcification projects over right kidney question
renal calculus versus vascular calcification
Otherwise negative exam.

## 2015-06-16 ENCOUNTER — Other Ambulatory Visit: Payer: Self-pay | Admitting: Urology

## 2015-06-16 DIAGNOSIS — N200 Calculus of kidney: Secondary | ICD-10-CM

## 2015-08-14 ENCOUNTER — Ambulatory Visit (HOSPITAL_COMMUNITY)
Admission: RE | Admit: 2015-08-14 | Discharge: 2015-08-14 | Disposition: A | Discharge status: Home / Self Care | Payer: 59 | Source: Ambulatory Visit | Attending: Urology | Admitting: Urology

## 2015-08-14 DIAGNOSIS — N200 Calculus of kidney: Secondary | ICD-10-CM | POA: Insufficient documentation

## 2015-08-19 ENCOUNTER — Encounter: Payer: Self-pay | Admitting: Family Medicine

## 2015-10-11 DIAGNOSIS — E782 Mixed hyperlipidemia: Secondary | ICD-10-CM

## 2015-10-11 HISTORY — DX: Mixed hyperlipidemia: E78.2

## 2016-02-10 ENCOUNTER — Telehealth: Payer: Self-pay | Admitting: Family Medicine

## 2016-02-10 NOTE — Telephone Encounter (Signed)
error 

## 2016-02-11 ENCOUNTER — Encounter: Payer: 59 | Admitting: Family Medicine

## 2016-02-12 ENCOUNTER — Encounter: Payer: Self-pay | Admitting: Family Medicine

## 2016-02-12 ENCOUNTER — Ambulatory Visit (INDEPENDENT_AMBULATORY_CARE_PROVIDER_SITE_OTHER): Payer: 59 | Admitting: Family Medicine

## 2016-02-12 VITALS — BP 109/66 | HR 50 | Temp 98.2°F | Resp 20 | Ht 72.0 in | Wt 241.8 lb

## 2016-02-12 DIAGNOSIS — Z114 Encounter for screening for human immunodeficiency virus [HIV]: Secondary | ICD-10-CM | POA: Diagnosis not present

## 2016-02-12 DIAGNOSIS — Z1159 Encounter for screening for other viral diseases: Secondary | ICD-10-CM

## 2016-02-12 DIAGNOSIS — L649 Androgenic alopecia, unspecified: Secondary | ICD-10-CM | POA: Diagnosis not present

## 2016-02-12 DIAGNOSIS — Z0000 Encounter for general adult medical examination without abnormal findings: Secondary | ICD-10-CM | POA: Diagnosis not present

## 2016-02-12 DIAGNOSIS — Z125 Encounter for screening for malignant neoplasm of prostate: Secondary | ICD-10-CM

## 2016-02-12 LAB — COMPREHENSIVE METABOLIC PANEL
ALT: 14 U/L (ref 0–53)
AST: 25 U/L (ref 0–37)
Albumin: 4.7 g/dL (ref 3.5–5.2)
Alkaline Phosphatase: 61 U/L (ref 39–117)
BUN: 14 mg/dL (ref 6–23)
CHLORIDE: 104 meq/L (ref 96–112)
CO2: 26 meq/L (ref 19–32)
Calcium: 9.4 mg/dL (ref 8.4–10.5)
Creatinine, Ser: 0.95 mg/dL (ref 0.40–1.50)
GFR: 87.95 mL/min (ref >60.00)
GLUCOSE: 103 mg/dL — AB (ref 70–99)
POTASSIUM: 4.1 meq/L (ref 3.5–5.1)
SODIUM: 138 meq/L (ref 135–145)
Total Bilirubin: 1 mg/dL (ref 0.2–1.2)
Total Protein: 7.4 g/dL (ref 6.0–8.3)

## 2016-02-12 LAB — CBC WITH DIFFERENTIAL/PLATELET
BASOS PCT: 0.7 % (ref 0.0–3.0)
Basophils Absolute: 0 10*3/uL (ref 0.0–0.1)
EOS PCT: 2.3 % (ref 0.0–5.0)
Eosinophils Absolute: 0.1 10*3/uL (ref 0.0–0.7)
HCT: 43.3 % (ref 39.0–52.0)
HEMOGLOBIN: 14.8 g/dL (ref 13.0–17.0)
Lymphocytes Relative: 34.9 % (ref 12.0–46.0)
Lymphs Abs: 2.3 10*3/uL (ref 0.7–4.0)
MCHC: 34.1 g/dL (ref 30.0–36.0)
MCV: 87.9 fl (ref 78.0–100.0)
MONOS PCT: 8.8 % (ref 3.0–12.0)
Monocytes Absolute: 0.6 10*3/uL (ref 0.1–1.0)
Neutro Abs: 3.5 10*3/uL (ref 1.4–7.7)
Neutrophils Relative %: 53.3 % (ref 43.0–77.0)
Platelets: 213 10*3/uL (ref 150.0–400.0)
RBC: 4.92 Mil/uL (ref 4.22–5.81)
RDW: 13 % (ref 11.5–15.5)
WBC: 6.5 10*3/uL (ref 4.0–10.5)

## 2016-02-12 LAB — PSA: PSA: 1.07 ng/mL (ref 0.10–4.00)

## 2016-02-12 LAB — LIPID PANEL
CHOL/HDL RATIO: 6
Cholesterol: 220 mg/dL — ABNORMAL HIGH (ref 0–200)
HDL: 34.1 mg/dL — AB (ref >39.00)
NONHDL: 185.8
Triglycerides: 203 mg/dL — ABNORMAL HIGH (ref 0.0–149.0)
VLDL: 40.6 mg/dL — AB (ref 0.0–40.0)

## 2016-02-12 LAB — TSH: TSH: 1.49 u[IU]/mL (ref 0.35–4.50)

## 2016-02-12 LAB — LDL CHOLESTEROL, DIRECT: LDL DIRECT: 143 mg/dL

## 2016-02-12 MED ORDER — FINASTERIDE 1 MG PO TABS: 1.0000 mg | ORAL_TABLET | Freq: Every day | ORAL | Status: DC

## 2016-02-12 NOTE — Progress Notes (Signed)
Office Note 02/12/2016  CC:  Chief Complaint  Patient presents with  . Annual Exam    HPI:  Gary Collins is a 55 y.o. White male who is here for annual health maintenance exam.  He has trigger finger on left hand middle finger for the last 6-8 mo.  No pain, but some difficulty straightening it out, says it has a little "catch" in it.  He is considering ortho consultation at this time, says he'll get back with me.  Notes some hair loss on vertex of scalp gradually over the last few years, asks about meds to help with this.     Past Medical History  Diagnosis Date  . Pilonidal cyst with abscess 07/15/2011    Qualifier: Diagnosis of  By: Alveta Heimlich MD, Cornelia Copa    . 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  Followed by Dr. Loanne Drilling.   No changes on u/s f/u 09/2012  . 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 Nephrolithiasis 05/30/2013    7 mm nonobstructing right mid kidney calculus--uric acid stone (started on potassium citrate by urol).  Grew to 1.3 cm as of 01/27/14 urol f/u, watchful waiting approach with increase in pot citrate to 20 meq bid.  07/2014 f/u stable 8 mm stones.  . Inguinal hernia recurrent unilateral 05/30/13    Small, right sided, containing adipose tissue (noted on CT scan for hematuria)  . Renal cyst 05/30/13    X 2, small, stable and benign-appearing (on CT scan for hematuria and on f/u u/s by urologist 08/2015)  . Gross hematuria 06/2013    Right kidney radiolucent stone believed to be uric acid stone, cysto normal:  potassium citrate ER 20 mEQ (urocit K) bid by urology  . Allergy   . Adenomatous colon polyp 11/18/13    Past Surgical History  Procedure Laterality Date  . Knee arthroscopy      left  . Biopsy thyroid    . Colonoscopy w/ polypectomy      Adenomatous: recall 3 yrs.    Family History  Problem Relation Age of Onset  . Cancer Father      bladder  . Colon cancer Neg Hx     Social History   Social History  . Marital Status: Married    Spouse Name: [name]  . Number of Children: [name]  . Years of Education: [name]   Occupational History  . Not on file.   Social History Main Topics  . Smoking status: Never Smoker   . Smokeless tobacco: Never Used  . Alcohol Use: 4.8 oz/week    8 Cans of beer per week  . Drug Use: No  . Sexual Activity: Not on file   Other Topics Concern  . Not on file   Social History Narrative   Married, 54 y/o girl and 84 boy.   Occupation: Clinical biochemist (residential).   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   Enjoys fishing.  Exercises 2-3 times per week: 30 min elliptical.  Also plays soccer.   No tob, occ beer, no drugs.          Outpatient Prescriptions Prior to Visit  Medication Sig Dispense Refill  . Azelastine HCl 0.15 % SOLN USE 2 SPRAYS IN EACH NOSTRIL AT BEDTIME EACH NIGHT. 30 mL 11  . cetirizine (ZYRTEC) 10 MG tablet Take 10 mg by mouth daily.    . fluticasone (  FLONASE) 50 MCG/ACT nasal spray 2 sprays each nostril once every morning 16 g 12  . Omega-3 Fatty Acids (FISH OIL PO) Take 4 tablets by mouth daily.    . potassium chloride (K-DUR) 10 MEQ tablet Take 10 mEq by mouth 3 (three) times daily.     No facility-administered medications prior to visit.    Allergies  Allergen Reactions  . Beta Adrenergic Blockers Other (See Comments)    Not good for me per dr.  . Ibuprofen     REACTION: lip swelling    ROS Review of Systems  Constitutional: Negative for fever, chills, appetite change and fatigue.  HENT: Negative for congestion, dental problem, ear pain and sore throat.   Eyes: Negative for discharge, redness and visual disturbance.  Respiratory: Negative for cough, chest tightness, shortness of breath and wheezing.   Cardiovascular: Negative for chest pain, palpitations and leg swelling.  Gastrointestinal: Negative for nausea, vomiting, abdominal pain, diarrhea and blood in stool.   Genitourinary: Negative for dysuria, urgency, frequency, hematuria, flank pain and difficulty urinating.  Musculoskeletal: Negative for myalgias, back pain, joint swelling, arthralgias and neck stiffness.  Skin: Negative for pallor and rash.       Losing hair sparsely in vertex of scalp  Neurological: Negative for dizziness, speech difficulty, weakness and headaches.  Hematological: Negative for adenopathy. Does not bruise/bleed easily.  Psychiatric/Behavioral: Negative for confusion and sleep disturbance. The patient is not nervous/anxious.     PE; Blood pressure 109/66, pulse 50, temperature 98.2 F (36.8 C), temperature source Oral, resp. rate 20, height 6' (1.829 m), weight 241 lb 12 oz (109.657 kg), SpO2 98 %. Gen: Alert, well appearing.  Patient is oriented to person, place, time, and situation. AFFECT: pleasant, lucid thought and speech. ENT: Ears: EACs clear, normal epithelium.  TMs with good light reflex and landmarks bilaterally.  Eyes: no injection, icteris, swelling, or exudate.  EOMI, PERRLA. Scalp: sparse thinning of hair in vertex region of scalp Nose: no drainage or turbinate edema/swelling.  No injection or focal lesion.  Mouth: lips without lesion/swelling.  Oral mucosa pink and moist.  Dentition intact and without obvious caries or gingival swelling.  Oropharynx without erythema, exudate, or swelling.  Neck: supple/nontender.  No LAD, mass, or TM.  Carotid pulses 2+ bilaterally, without bruits. CV: RRR, no m/r/g.   LUNGS: CTA bilat, nonlabored resps, good aeration in all lung fields. ABD: soft, NT, ND, BS normal.  No hepatospenomegaly or mass.  No bruits. EXT: no clubbing, cyanosis, or edema.  Musculoskeletal: no joint swelling, erythema, warmth, or tenderness.  ROM of all joints intact. Skin - no sores or suspicious lesions or rashes or color changes Rectal exam: negative without mass, lesions or tenderness, PROSTATE EXAM: smooth and symmetric without nodules or  tenderness.  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  Trial of finasteride 1 mg qd rx'd.  2) Trigger finger, left hand middle finger: pt to call if he wants referral to orthopedist for further evaluation and management.  3) Health maintenance exam:  Reviewed age and gender appropriate health maintenance issues (prudent diet, regular exercise, health risks of tobacco and excessive alcohol, use of seatbelts, fire alarms in home, use of sunscreen).  Also reviewed age and gender appropriate health screening as well as vaccine recommendations. Fasting HP labs today.  He declined HIV and Hep C screening. Colon ca screening: due for colonoscopy repeat in 2018. Prostate ca screening: DRE normal.  PSA  drawn today.  An After Visit Summary was printed and given to the patient.  FOLLOW UP:  Return in about 1 year (around 02/11/2017) for annual CPE (fasting).  Signed:  Crissie Sickles, MD           02/12/2016

## 2016-02-14 ENCOUNTER — Encounter: Payer: Self-pay | Admitting: Family Medicine

## 2016-02-15 ENCOUNTER — Telehealth: Payer: Self-pay

## 2016-02-15 NOTE — Telephone Encounter (Signed)
-----   Message from Tammi Sou, MD sent at 02/14/2016  6:52 PM EDT ----- All labs normal except his cholesterol was slightly elevated.  Not to the point of requiring medications at this time. Encourage low fat, low carb diet and increase in cardiovascular exercise.

## 2016-02-15 NOTE — Telephone Encounter (Signed)
Called patient. Spoked to lta. Ok to try HP# after 11am today.

## 2016-02-16 NOTE — Telephone Encounter (Signed)
Spoke to patient. Gave lab results and instructions. Patient verbalized understanding.

## 2016-05-03 ENCOUNTER — Encounter (HOSPITAL_COMMUNITY): Payer: Self-pay | Admitting: Emergency Medicine

## 2016-05-03 ENCOUNTER — Ambulatory Visit (HOSPITAL_COMMUNITY)
Admission: EM | Admit: 2016-05-03 | Discharge: 2016-05-03 | Disposition: A | Discharge status: Home / Self Care | Payer: 59 | Attending: Family Medicine | Admitting: Family Medicine

## 2016-05-03 DIAGNOSIS — H6121 Impacted cerumen, right ear: Secondary | ICD-10-CM

## 2016-05-03 MED ORDER — NEOMYCIN-POLYMYXIN-HC 3.5-10000-1 OT SUSP: 4.0000 [drp] | Freq: Three times a day (TID) | OTIC | 0 refills | Status: DC

## 2016-05-03 NOTE — ED Triage Notes (Signed)
PT reports wax impaction in right ear. PT reports this happens every few years. This has bothered him for one week.

## 2016-05-03 NOTE — ED Notes (Signed)
Script called in to [facility] outpatient pharm

## 2016-05-03 NOTE — ED Provider Notes (Signed)
[place]    CSN: [id number] Arrival date & time: 05/03/16  1045  First Provider Contact:  First MD Initiated Contact with Patient 05/03/16 1206        History   Chief Complaint Chief Complaint  Patient presents with  . Ear Fullness    HPI Gary Collins is a 55 y.o. male.   The history is provided by the patient.  Ear Fullness  This is a recurrent problem. The current episode started more than 1 week ago (chronic problem twice a yr is bothered.). The problem has not changed since onset.Pertinent negatives include no chest pain, no abdominal pain and no headaches. Nothing aggravates the symptoms. Nothing relieves the symptoms.    Past Medical History:  Diagnosis Date  . Adenomatous colon polyp 11/18/13  . Allergy   . Gross hematuria 06/2013   Right kidney radiolucent stone believed to be uric acid stone, cysto normal:  potassium citrate ER 20 mEQ (urocit K) bid by urology  . Hyperlipidemia, mixed 2017   TLC recommended  . Inguinal hernia recurrent unilateral 05/30/13   Small, right sided, containing adipose tissue (noted on CT scan for hematuria)  . Nephrolithiasis 05/30/2013   7 mm nonobstructing right mid kidney calculus--uric acid stone (started on potassium citrate by urol).  Grew to 1.3 cm as of 01/27/14 urol f/u, watchful waiting approach with increase in pot citrate to 20 meq bid.  07/2014 f/u stable 8 mm stones.  . 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 Pilonidal cyst with abscess 07/15/2011   Qualifier: Diagnosis of  By: Alveta Heimlich MD, Cornelia Copa    . Renal cyst 05/30/13   X 2, small, stable and benign-appearing (on CT scan for hematuria and on f/u u/s by urologist 08/2015)  . 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  Followed by Dr. Loanne Drilling.   No changes on u/s f/u 09/2012    Patient Active Problem List   Diagnosis Date Noted  . Gross hematuria  05/16/2013  . Hyperlipidemia 05/08/2013  . Subcutaneous nodule 05/08/2013  . Chronic rhinitis 05/08/2013  . Health maintenance examination 10/25/2012  . Scrotal mass 10/25/2012  . THYROID MASS 03/02/2009    Past Surgical History:  Procedure Laterality Date  . BIOPSY THYROID    . COLONOSCOPY W/ POLYPECTOMY     Adenomatous: recall 3 yrs.  Marland Kitchen KNEE ARTHROSCOPY     left       Home Medications    Prior to Admission medications   Medication Sig Start Date End Date Taking? Authorizing Provider  Azelastine HCl 0.15 % SOLN USE 2 SPRAYS IN EACH NOSTRIL AT BEDTIME EACH NIGHT. 10/24/14   Tammi Sou, MD  cetirizine (ZYRTEC) 10 MG tablet Take 10 mg by mouth daily.    Historical Provider, MD  finasteride (PROPECIA) 1 MG tablet Take 1 tablet (1 mg total) by mouth daily. 02/12/16   Tammi Sou, MD  fluticasone Asencion Islam) 50 MCG/ACT nasal spray 2 sprays each nostril once every morning 11/07/13   Tammi Sou, MD  Omega-3 Fatty Acids (FISH OIL PO) Take 4 tablets by mouth daily.    Historical Provider, MD    Family History Family History  Problem Relation Age of Onset  . Cancer Father     bladder  . Colon cancer Neg Hx     Social History Social History  Substance Use Topics  .  Smoking status: Never Smoker  . Smokeless tobacco: Never Used  . Alcohol use 7.2 oz/week    12 Cans of beer per week     Comment: 12 pack per week     Allergies   Beta adrenergic blockers and Ibuprofen   Review of Systems Review of Systems  Constitutional: Negative.   HENT: Positive for ear pain. Negative for ear discharge, hearing loss, postnasal drip and rhinorrhea.   Cardiovascular: Negative for chest pain.  Gastrointestinal: Negative for abdominal pain.  Neurological: Negative for headaches.  All other systems reviewed and are negative.    Physical Exam Triage Vital Signs ED Triage Vitals  Enc Vitals Group     BP 05/03/16 1156 154/95     Pulse Rate 05/03/16 1156 (!) 56     Resp  05/03/16 1156 16     Temp 05/03/16 1156 98.8 F (37.1 C)     Temp Source 05/03/16 1156 Oral     SpO2 05/03/16 1156 97 %     Weight --      Height --      Head Circumference --      Peak Flow --      Pain Score 05/03/16 1155 2     Pain Loc --      Pain Edu? --      Excl. in [place]? --    No data found.   Updated Vital Signs BP 154/95 (BP Location: Right Arm)   Pulse (!) 56   Temp 98.8 F (37.1 C) (Oral)   Resp 16   SpO2 97%   Visual Acuity Right Eye Distance:   Left Eye Distance:   Bilateral Distance:    Right Eye Near:   Left Eye Near:    Bilateral Near:     Physical Exam  Constitutional: He is oriented to person, place, and time. He appears well-developed and well-nourished.  HENT:  Left Ear: External ear normal.  Mouth/Throat: Oropharynx is clear and moist.  Cerumen impaction  Completely obscuring tm   Neck: Normal range of motion. Neck supple.  Lymphadenopathy:    He has no cervical adenopathy.  Neurological: He is alert and oriented to person, place, and time.  Skin: Skin is warm and dry.  Nursing note and vitals reviewed.    UC Treatments / Results  Labs (all labs ordered are listed, but only abnormal results are displayed) Labs Reviewed - No data to display  EKG  EKG Interpretation None       Radiology No results found.  Procedures Procedures (including critical care time)  Medications Ordered in UC Medications - No data to display   Initial Impression / Assessment and Plan / UC Course  I have reviewed the triage vital signs and the nursing notes.  Pertinent labs & imaging results that were available during my care of the patient were reviewed by me and considered in my medical decision making (see chart for details).  Clinical Course  sx releived after irrig.    Final Clinical Impressions(s) / UC Diagnoses   Final diagnoses:  None    New Prescriptions New Prescriptions   No medications on file     Billy Fischer,  MD 05/03/16 1222

## 2016-07-29 DIAGNOSIS — Z23 Encounter for immunization: Secondary | ICD-10-CM | POA: Diagnosis not present

## 2016-09-12 DIAGNOSIS — N200 Calculus of kidney: Secondary | ICD-10-CM | POA: Diagnosis not present

## 2016-09-15 ENCOUNTER — Other Ambulatory Visit: Payer: Self-pay | Admitting: Urology

## 2016-09-19 ENCOUNTER — Encounter: Payer: Self-pay | Admitting: Family Medicine

## 2016-10-12 ENCOUNTER — Encounter (HOSPITAL_COMMUNITY): Payer: Self-pay | Admitting: *Deleted

## 2016-10-16 NOTE — H&P (Signed)
Office Visit Report     09/12/2016   --------------------------------------------------------------------------------   [place]  MRN: [id number]  PRIMARY CARE:  Shawnie Dapper, MD  DOB: [birth year], 56 year old Male  REFERRING:  Shawnie Dapper, MD  [phone]  PROVIDER:  Irine Seal, M.D.    LOCATION:  [facility] [phone]   --------------------------------------------------------------------------------   CC: I have kidney stones.  HPI: Gary Collins is a 56 year-old male established patient who is here for renal calculi.    Gary Collins returns today in f/u. He has a history of a right renal stone that is felt to be uric acid but with a calcium component since it is radiopaque. He returns today with some increased symptoms and a desire to seek treatment. .Last week he had more severe pain with nausea. He will have increased pain a day after physical activity. KUB and 75mm stone in the right mid kidney. He is no longer taking potassium citrate qid. He has no associated signs or symptoms.       ALLERGIES: Ibuprofen CAPS NSAIDs    MEDICATIONS: Fish Oil CAPS Oral  Nasal Spray SOLN Nasal  Potassium Citrate ER 10 MEQ (1080 MG) Oral Tablet Extended Release 0 Oral  Zyrtec TABS Oral     GU PSH: Vasectomy - 2014      PSH Notes: Surgery Of Male Genitalia Vasectomy, Knee Surgery   NON-GU PSH: None   GU PMH: Gross hematuria, Gross hematuria - 08/17/2015 Kidney Stone, Nephrolithiasis - 08/17/2015      PMH Notes:  2013-05-30 15:40:24 - Note: Angioedema   NON-GU PMH: Encounter for general adult medical examination without abnormal findings, Encounter for preventive health examination - 2015 Personal history of other endocrine, nutritional and metabolic disease, History of hypercholesterolemia - 2014    FAMILY HISTORY: Bladder Cancer - Father Family Health Status Number - Runs In Family Family Health Status Of Father - Alive - Runs In Family Family Health  Status Of Mother - Alive 10 - Runs In Family   SOCIAL HISTORY: None    Notes: Never A Smoker, Marital History - Currently Married, Occupation:, Caffeine Use, Alcohol Use   REVIEW OF SYSTEMS:    GU Review Male:   Patient denies frequent urination, hard to postpone urination, burning/ pain with urination, get up at night to urinate, leakage of urine, stream starts and stops, trouble starting your stream, have to strain to urinate , erection problems, and penile pain.  Gastrointestinal (Upper):   Patient reports nausea. Patient denies vomiting and indigestion/ heartburn.  Gastrointestinal (Lower):   He has had right flank pain. Patient denies diarrhea and constipation.  Constitutional:   Patient denies fever, night sweats, weight loss, and fatigue.  Skin:   Patient denies skin rash/ lesion and itching.  Eyes:   Patient denies blurred vision and double vision.  Ears/ Nose/ Throat:   Patient denies sore throat and sinus problems.  Hematologic/Lymphatic:   Patient denies swollen glands and easy bruising.  Cardiovascular:   Patient denies leg swelling and chest pains.  Respiratory:   Patient denies shortness of breath and cough.  Endocrine:   Patient denies excessive thirst.  Musculoskeletal:   Patient denies back pain and joint pain.  Neurological:   Patient denies headaches and dizziness.  Psychologic:   Patient denies depression and anxiety.   VITAL SIGNS:      09/12/2016 02:00 PM  Weight 220 lb / 99.79 kg  Height 72 in / 182.88 cm  BP 134/83  mmHg  Pulse 67 /min  Temperature 98.5 F / 37 C  BMI 29.8 kg/m   MULTI-SYSTEM PHYSICAL EXAMINATION:    Constitutional: Well-nourished. No physical deformities. Normally developed. Good grooming.  Respiratory: CTA with normal breath sounds.   Cardiovascular: Normal temperature, RRR without murmur.     PAST DATA REVIEWED:  Source Of History:  Patient  Urine Test Review:   Urinalysis  X-Ray Review: KUB: Reviewed Films. 59mm right renal stone.     PROCEDURES:         KUB - 74000  A single view of the abdomen is obtained. His stone in the right renal pelvis remains 58mm but is more radiopaque. He has no other bone, gas or soft tissue shadows.       97mm right renal stone.         Urinalysis w/Scope Dipstick Dipstick Cont'd Micro  Color: Yellow Bilirubin: Neg WBC/hpf: NS (Not Seen)  Appearance: Clear Ketones: Neg RBC/hpf: 0 - 2/hpf  Specific Gravity: 1.025 Blood: Trace Bacteria: NS (Not Seen)  pH: 5.5 Protein: Neg Cystals: NS (Not Seen)  Glucose: Neg Urobilinogen: 0.2 Casts: NS (Not Seen)    Nitrites: Neg Trichomonas: Not Present    Leukocyte Esterase: Neg Mucous: Not Present      Epithelial Cells: 0 - 5/hpf      Yeast: NS (Not Seen)      Sperm: Not Present    ASSESSMENT:      ICD-10 Details  1 GU:   Kidney Stone - N20.0 Worsening   PLAN:           Schedule Return Visit: ASAP - Schedule Surgery             Note: Desires a monday litho 12/18 is best.           Document Letter(s):  Created for Patient: Clinical Summary         Notes:   He has increased symptoms with the 73mm right renal pelvic stone and would like to proceed with ESWL.   I have reviewed the risks of bleeding, infection, injury to the kidney or adjacent structures, need for secondary procedures for poor fragmentation or obstructing fragments, thrombotic events and sedation complications.   I will get him set up in the next couple of weeks.   CC: Dr. Shawnie Dapper.     Signed by Irine Seal, M.D. on 09/12/16 at 2:27 PM (EST)     The information contained in this medical record document is considered private and confidential patient information. This information can only be used for the medical diagnosis and/or medical services that are being provided by the patient's selected caregivers. This information can only be distributed outside of the patient's care if the patient agrees and signs waivers of authorization for this information to be sent to  an outside source or route.

## 2016-10-17 ENCOUNTER — Ambulatory Visit (HOSPITAL_COMMUNITY)
Admission: RE | Admit: 2016-10-17 | Discharge: 2016-10-17 | Disposition: A | Discharge status: Home / Self Care | Payer: 59 | Source: Ambulatory Visit | Attending: Urology | Admitting: Urology

## 2016-10-17 ENCOUNTER — Encounter (HOSPITAL_COMMUNITY)
Admission: RE | Disposition: A | Discharge status: Home / Self Care | Payer: Self-pay | Source: Ambulatory Visit | Attending: Urology

## 2016-10-17 ENCOUNTER — Encounter (HOSPITAL_COMMUNITY): Payer: Self-pay | Admitting: General Practice

## 2016-10-17 ENCOUNTER — Ambulatory Visit (HOSPITAL_COMMUNITY): Payer: 59

## 2016-10-17 DIAGNOSIS — N200 Calculus of kidney: Secondary | ICD-10-CM | POA: Insufficient documentation

## 2016-10-17 HISTORY — PX: EXTRACORPOREAL SHOCK WAVE LITHOTRIPSY: SHX1557

## 2016-10-17 HISTORY — DX: Personal history of urinary calculi: Z87.442

## 2016-10-17 SURGERY — LITHOTRIPSY, ESWL
Anesthesia: LOCAL | Laterality: Right

## 2016-10-17 MED ORDER — DIAZEPAM 5 MG PO TABS
10.0000 mg | ORAL_TABLET | ORAL | Status: AC
  Administered 2016-10-17: 10 mg via ORAL
  Filled 2016-10-17: qty 2

## 2016-10-17 MED ORDER — DIPHENHYDRAMINE HCL 25 MG PO CAPS
25.0000 mg | ORAL_CAPSULE | ORAL | Status: AC
  Administered 2016-10-17: 25 mg via ORAL
  Filled 2016-10-17: qty 1

## 2016-10-17 MED ORDER — CIPROFLOXACIN HCL 500 MG PO TABS
500.0000 mg | ORAL_TABLET | ORAL | Status: AC
  Administered 2016-10-17: 500 mg via ORAL
  Filled 2016-10-17: qty 1

## 2016-10-17 MED ORDER — SODIUM CHLORIDE 0.9 % IV SOLN
INTRAVENOUS | Status: DC
  Administered 2016-10-17 (×2): via INTRAVENOUS

## 2016-10-17 NOTE — Interval H&P Note (Signed)
History and Physical Interval Note:  10/17/2016 8:36 AM  Gary Collins  has presented today for surgery, with the diagnosis of RIGHT RENAL STONE  The various methods of treatment have been discussed with the patient and family. After consideration of risks, benefits and other options for treatment, the patient has consented to  Procedure(s): RIGHT EXTRACORPOREAL SHOCK WAVE LITHOTRIPSY (ESWL) (Right) as a surgical intervention .  The patient's history has been reviewed, patient examined, no change in status, stable for surgery.  I have reviewed the patient's chart and labs.  Questions were answered to the patient's satisfaction.     Gary Collins I Gary Collins Right  arm marked KUB reviewed  Anesthesia: Class 1 oral cavity

## 2016-10-17 NOTE — Discharge Instructions (Signed)
Dietary Guidelines to Help Prevent Kidney Stones Your risk of kidney stones can be decreased by adjusting the foods you eat. The most important thing you can do is drink enough fluid. You should drink enough fluid to keep your urine clear or pale yellow. The following guidelines provide specific information for the type of kidney stone you have had. Guidelines according to type of kidney stone Calcium Oxalate Kidney Stones  Reduce the amount of salt you eat. Foods that have a lot of salt cause your body to release excess calcium into your urine. The excess calcium can combine with a substance called oxalate to form kidney stones.  Reduce the amount of animal protein you eat if the amount you eat is excessive. Animal protein causes your body to release excess calcium into your urine. Ask your dietitian how much protein from animal sources you should be eating.  Avoid foods that are high in oxalates. If you take vitamins, they should have less than 500 mg of vitamin C. Your body turns vitamin C into oxalates. You do not need to avoid fruits and vegetables high in vitamin C. Calcium Phosphate Kidney Stones  Reduce the amount of salt you eat to help prevent the release of excess calcium into your urine.  Reduce the amount of animal protein you eat if the amount you eat is excessive. Animal protein causes your body to release excess calcium into your urine. Ask your dietitian how much protein from animal sources you should be eating.  Get enough calcium from food or take a calcium supplement (ask your dietitian for recommendations). Food sources of calcium that do not increase your risk of kidney stones include:  Broccoli.  Dairy products, such as cheese and yogurt.  Pudding. Uric Acid Kidney Stones  Do not have more than 6 oz of animal protein per day. Food sources Publishing rights manager (all types).  Poultry.  Eggs.  Fish, seafood. Foods High in [employer] seasonings.  Soy  sauce.  Teriyaki sauce.  Cured and processed meats.  Salted crackers and snack foods.  Fast food.  Canned soups and most canned foods. Foods High in Oxalates  Grains:  Amaranth.  Barley.  Grits.  Wheat germ.  Bran.  Buckwheat flour.  All bran cereals.  Pretzels.  Whole wheat bread.  Vegetables:  Beans (wax).  Beets and beet greens.  Collard greens.  Eggplant.  Escarole.  Leeks.  Okra.  Parsley.  Rutabagas.  Spinach.  Swiss chard.  Tomato paste.  Fried potatoes.  Sweet potatoes.  Fruits:  Red currants.  Figs.  Kiwi.  Rhubarb.  Meat and Other Protein Sources:  Beans (dried).  Soy burgers and other soybean products.  Miso.  Nuts (peanuts, almonds, pecans, cashews, hazelnuts).  Nut butters.  Sesame seeds and tahini (paste made of sesame seeds).  Poppy seeds.  Beverages:  Chocolate drink mixes.  Soy milk.  Instant iced tea.  Juices made from high-oxalate fruits or vegetables.  Other:  Carob.  Chocolate.  Fruitcake.  Marmalades. This information is not intended to replace advice given to you by your health care provider. Make sure you discuss any questions you have with your health care provider. Document Released: 01/21/2011 Document Revised: 03/03/2016 Document Reviewed: 08/23/2013 Elsevier Interactive Patient Education  2017 [place].   Kidney Stones Kidney stones (urolithiasis) are solid, rock-like deposits that form inside of the organs that make urine (kidneys). A kidney stone may form in a kidney and move into the bladder, where  it can cause intense pain and block the flow of urine. Kidney stones are created when high levels of certain minerals are found in the urine. They are usually passed through urination, but in some cases, medical treatment may be needed to remove them. What are the causes? Kidney stones may be caused by:  A condition in which certain glands produce too much parathyroid  hormone (primary hyperparathyroidism), which causes too much calcium buildup in the blood.  Buildup of uric acid crystals in the bladder (hyperuricosuria). Uric acid is a chemical that the body produces when you eat certain foods. It usually exits the body in the urine.  Narrowing (stricture) of one or both of the tubes that drain urine from the kidneys to the bladder (ureters).  A kidney blockage that is present at birth (congenital obstruction).  Past surgery on the kidney or the ureters, such as gastric bypass surgery. What increases the risk? The following factors make you more likely to develop kidney stones:  Having had a kidney stone in the past.  Having a family history of kidney stones.  Not drinking enough water.  Eating a diet that is high in protein, salt (sodium), or sugar.  Being overweight or obese. What are the signs or symptoms? Symptoms of a kidney stone may include:  Nausea.  Vomiting.  Blood in the urine (hematuria).  Pain in the side of the abdomen, right below the ribs (flank pain). Pain usually spreads (radiates) to the groin.  Needing to urinate frequently or urgently. How is this diagnosed? This condition may be diagnosed based on:  Your medical history.  A physical exam.  Blood tests.  Urine tests.  CT scan.  Abdominal X-ray.  A procedure to examine the inside of the bladder (cystoscopy). How is this treated? Treatment for kidney stones depends on the size, location, and makeup of the stones. Treatment may involve:  Analyzing your urine before and after you pass the stone through urination.  Being monitored at the hospital until you pass the stone through urination.  Increasing your fluid intake and decreasing the amount of calcium and protein in your diet.  A procedure to break up kidney stones in the bladder using:  A focused beam of light (laser therapy).  Shock waves (extracorporeal shock wave lithotripsy).  Surgery to  remove kidney stones. This may be needed if you have severe pain or have stones that block your urinary tract. Follow these instructions at home: Eating and drinking  Drink enough fluid to keep your urine clear or pale yellow. This will help you to pass the kidney stone.  If directed, change your diet. This may include:  Limiting how much sodium you eat.  Eating more fruits and vegetables.  Limiting how much meat, poultry, fish, and eggs you eat.  Follow instructions from your health care provider about eating or drinking restrictions. General instructions  Collect urine samples as told by your health care provider. You may need to collect a urine sample:  24 hours after you pass the stone.  8-12 weeks after passing the kidney stone, and every 6-12 months after that.  Strain your urine every time you urinate, for as long as directed. Use the strainer that your health care provider recommends.  Do not throw out the kidney stone after passing it. Keep the stone so it can be tested by your health care provider. Testing the makeup of your kidney stone may help prevent you from getting kidney stones in the future.  Take over-the-counter and prescription medicines only as told by your health care provider.  Keep all follow-up visits as told by your health care provider. This is important. You may need follow-up X-rays or ultrasounds to make sure that your stone has passed. How is this prevented? To prevent another kidney stone:  Drink enough fluid to keep your urine clear or pale yellow. This is the best way to prevent kidney stones.  Eat a healthy diet and follow recommendations from your health care provider about foods to avoid. You may be instructed to eat a low-protein diet. Recommendations vary depending on the type of kidney stone that you have.  Maintain a healthy weight. Contact a health care provider if:  You have pain that gets worse or does not get better with  medicine. Get help right away if:  You have a fever or chills.  You develop severe pain.  You develop new abdominal pain.  You faint.  You are unable to urinate. This information is not intended to replace advice given to you by your health care provider. Make sure you discuss any questions you have with your health care provider. Document Released: 09/26/2005 Document Revised: 04/15/2016 Document Reviewed: 03/11/2016 Elsevier Interactive Patient Education  2017 [employer].

## 2016-10-18 DIAGNOSIS — N200 Calculus of kidney: Secondary | ICD-10-CM | POA: Diagnosis not present

## 2016-10-24 ENCOUNTER — Encounter: Payer: Self-pay | Admitting: Gastroenterology

## 2016-10-31 DIAGNOSIS — N200 Calculus of kidney: Secondary | ICD-10-CM | POA: Diagnosis not present

## 2016-11-04 DIAGNOSIS — N200 Calculus of kidney: Secondary | ICD-10-CM | POA: Diagnosis not present

## 2016-11-28 DIAGNOSIS — N200 Calculus of kidney: Secondary | ICD-10-CM | POA: Diagnosis not present

## 2016-12-30 ENCOUNTER — Encounter (HOSPITAL_COMMUNITY): Payer: Self-pay | Admitting: Urology

## 2016-12-30 DIAGNOSIS — N200 Calculus of kidney: Secondary | ICD-10-CM | POA: Diagnosis not present

## 2016-12-30 DIAGNOSIS — N1330 Unspecified hydronephrosis: Secondary | ICD-10-CM | POA: Diagnosis not present

## 2017-02-10 ENCOUNTER — Encounter: Payer: Self-pay | Admitting: Endocrinology

## 2017-02-10 ENCOUNTER — Ambulatory Visit (INDEPENDENT_AMBULATORY_CARE_PROVIDER_SITE_OTHER): Payer: 59 | Admitting: Endocrinology

## 2017-02-10 VITALS — BP 128/87 | HR 69 | Ht 72.0 in | Wt 249.0 lb

## 2017-02-10 DIAGNOSIS — E041 Nontoxic single thyroid nodule: Secondary | ICD-10-CM

## 2017-02-10 NOTE — Progress Notes (Signed)
Subjective:    Patient ID: Guillermina City, male    DOB: 1961-04-05, 56 y.o.   MRN: [id number]  HPI Pt is here for thyroid nodule.  In 2010, pt was incidentally noted on MRI to have a right thyroid mass.  bx showed non-neoplastic goiter.  f/u US in 2013 showed slight interval enlargement.  He does not notice the goiter.  pt states he feels well in general.  He has no h/o XRT or surgery to the neck.   Past Medical History:  Diagnosis Date  . Adenomatous colon polyp 11/18/13  . Allergy   . Gross hematuria 06/2013   Right kidney radiolucent stone believed to be uric acid stone, cysto normal:  potassium citrate ER 20 mEQ (urocit K) bid by urology  . History of kidney stones   . Hyperlipidemia, mixed 2017   TLC recommended  . Inguinal hernia recurrent unilateral 05/30/13   Small, right sided, containing adipose tissue (noted on CT scan for hematuria)  . 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 Pilonidal cyst with abscess 07/15/2011   Qualifier: Diagnosis of  By: Alveta Heimlich MD, Cornelia Copa    . 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  Followed by Dr. Loanne Drilling.   No changes on u/s f/u 09/2012    Past Surgical History:  Procedure Laterality Date  . BIOPSY THYROID    . COLONOSCOPY W/ POLYPECTOMY     Adenomatous: recall 3 yrs.  Marland Kitchen EXTRACORPOREAL SHOCK WAVE LITHOTRIPSY Right 10/17/2016   Procedure: RIGHT EXTRACORPOREAL SHOCK WAVE LITHOTRIPSY (ESWL);  Surgeon: Carolan Clines, MD;  Location: [facility];  Service: Urology;  Laterality: Right;  . KNEE ARTHROSCOPY     left    Social History   Social History  . Marital status: Married    Spouse name: [name]  . Number of children: [name]  . Years of education: [name]   Occupational History  . Not on file.   Social History Main Topics  . Smoking status: Never Smoker  . Smokeless tobacco: Never Used  . Alcohol use 7.2 oz/week    12 Cans of beer per week      Comment: 12 pack per week  . Drug use: No  . Sexual activity: Not on file   Other Topics Concern  . Not on file   Social History Narrative   Married, 55 y/o girl and 59 boy.   Occupation: Clinical biochemist (residential).   Orig from [place], [place].   Enjoys fishing.  Exercises 2-3 times per week: 30 min elliptical.  Also plays soccer.   No tob, occ beer, no drugs.          Current Outpatient Prescriptions on File Prior to Visit  Medication Sig Dispense Refill  . Azelastine HCl 0.15 % SOLN USE 2 SPRAYS IN EACH NOSTRIL AT BEDTIME EACH NIGHT. 30 mL 11  . cetirizine (ZYRTEC) 10 MG tablet Take 10 mg by mouth daily.    . finasteride (PROPECIA) 1 MG tablet Take 1 tablet (1 mg total) by mouth daily. 30 tablet 12  . Omega-3 Fatty Acids (FISH OIL PO) Take 4 tablets by mouth daily.     No current facility-administered medications on file prior to visit.     Allergies  Allergen Reactions  . Beta Adrenergic Blockers Other (See Comments)    Not good for me per dr.  . Ibuprofen     REACTION:  lip swelling  . Nsaids Other (See Comments)    Lip swelling    Family History  Problem Relation Age of Onset  . Cancer Father     bladder  . Colon cancer Neg Hx     BP 128/87   Pulse 69   Ht 6' (1.829 m)   Wt 249 lb (112.9 kg)   SpO2 95%   BMI 33.77 kg/m   Review of Systems Denies weight change, hoarseness, neck pain, diplopia, chest pain, sob, cough, dysphagia, diarrhea, itching, flushing, easy bruising, depression, cold intolerance, headache, numbness, and rhinorrhea.     Objective:   Physical Exam VS: see vs page GEN: no distress HEAD: head: no deformity eyes: no periorbital swelling, no proptosis external nose and ears are normal mouth: no lesion seen NECK: supple, thyroid is not enlarged.  I cannot palpate the nodule. CHEST WALL: no deformity LUNGS: clear to auscultation CV: reg rate and rhythm, no murmur ABD: abdomen is soft, nontender.  no hepatosplenomegaly.  not  distended.  no hernia MUSCULOSKELETAL: muscle bulk and strength are grossly normal.  no obvious joint swelling.  gait is normal and steady EXTEMITIES: no deformity.  no edema PULSES: no carotid bruit NEURO:  cn 2-12 grossly intact.   readily moves all 4's.  sensation is intact to touch on all 4's SKIN:  Normal texture and temperature.  No rash or suspicious lesion is visible.   NODES:  None palpable at the neck PSYCH: alert, well-oriented.  Does not appear anxious nor depressed.    [place] (2013): Thyromegaly with stable solitary right nodule.  I have reviewed outside records, and summarized: Pt was last seen by PCP for wellness visit in [place].  thyroid nodule was not noted then.     Assessment & Plan:  Thyroid nodule, not recently addressed, non-palpable.  Due for f/u US, and for f/u TSH.  Patient Instructions  Let's recheck the ultrasound.  you will receive a phone call, about a day and time for an appointment.   Dr Anitra Lauth will continue to check your thyroid blood test each year.   If there is no significant change on the ultrasound, please return in 2 years.   If there is a significant change on the ultrasound, we'll recheck the biopsy.

## 2017-02-10 NOTE — Patient Instructions (Addendum)
Let's recheck the ultrasound.  you will receive a phone call, about a day and time for an appointment.   Dr Anitra Lauth will continue to check your thyroid blood test each year.   If there is no significant change on the ultrasound, please return in 2 years.   If there is a significant change on the ultrasound, we'll recheck the biopsy.

## 2017-02-15 ENCOUNTER — Encounter: Payer: Self-pay | Admitting: Family Medicine

## 2017-02-24 ENCOUNTER — Ambulatory Visit (HOSPITAL_COMMUNITY)
Admission: RE | Admit: 2017-02-24 | Discharge: 2017-02-24 | Disposition: A | Discharge status: Home / Self Care | Payer: 59 | Source: Ambulatory Visit | Attending: Endocrinology | Admitting: Endocrinology

## 2017-02-24 ENCOUNTER — Other Ambulatory Visit: Payer: Self-pay | Admitting: Endocrinology

## 2017-02-24 DIAGNOSIS — E041 Nontoxic single thyroid nodule: Secondary | ICD-10-CM | POA: Diagnosis not present

## 2017-04-24 ENCOUNTER — Telehealth: Payer: Self-pay | Admitting: Endocrinology

## 2017-04-24 DIAGNOSIS — E041 Nontoxic single thyroid nodule: Secondary | ICD-10-CM

## 2017-04-24 NOTE — Telephone Encounter (Signed)
Patient would like to have his biopsy  through [facility], please send referral there.

## 2017-04-24 NOTE — Telephone Encounter (Signed)
See message and please advise, Thanks!  

## 2017-04-24 NOTE — Telephone Encounter (Signed)
I ordered.  you will receive a phone call, about a day and time for an appointment 

## 2017-04-24 NOTE — Telephone Encounter (Signed)
Patient notified of message and voiced understanding. Neoma Laming, if at all possible the patient would like for this to be completed on Friday 04/28/2017 at [facility].

## 2017-04-25 NOTE — Telephone Encounter (Signed)
After calling his insurance, patient would like to instead to to [facility] radiology. Please change referral and send to [facility] radiology. He states that is they cannot do 04/28/2017, then he is available on Monday 05/01/2017  Cell is best contact

## 2017-05-12 ENCOUNTER — Encounter: Payer: Self-pay | Admitting: Gastroenterology

## 2017-05-12 ENCOUNTER — Other Ambulatory Visit: Payer: Self-pay | Admitting: Family Medicine

## 2017-06-01 ENCOUNTER — Ambulatory Visit
Admission: RE | Admit: 2017-06-01 | Discharge: 2017-06-01 | Disposition: A | Discharge status: Home / Self Care | Payer: 59 | Source: Ambulatory Visit | Attending: Endocrinology | Admitting: Endocrinology

## 2017-06-01 ENCOUNTER — Other Ambulatory Visit (HOSPITAL_COMMUNITY)
Admission: RE | Admit: 2017-06-01 | Discharge: 2017-06-01 | Disposition: A | Discharge status: Home / Self Care | Payer: 59 | Source: Ambulatory Visit | Attending: Interventional Radiology | Admitting: Interventional Radiology

## 2017-06-01 DIAGNOSIS — E041 Nontoxic single thyroid nodule: Secondary | ICD-10-CM | POA: Diagnosis not present

## 2017-06-02 ENCOUNTER — Encounter: Payer: Self-pay | Admitting: Family Medicine

## 2017-06-02 ENCOUNTER — Ambulatory Visit (INDEPENDENT_AMBULATORY_CARE_PROVIDER_SITE_OTHER): Payer: 59 | Admitting: Family Medicine

## 2017-06-02 VITALS — BP 110/71 | HR 57 | Temp 98.1°F | Resp 16 | Ht 72.0 in | Wt 242.5 lb

## 2017-06-02 DIAGNOSIS — Z125 Encounter for screening for malignant neoplasm of prostate: Secondary | ICD-10-CM | POA: Diagnosis not present

## 2017-06-02 DIAGNOSIS — Z0000 Encounter for general adult medical examination without abnormal findings: Secondary | ICD-10-CM

## 2017-06-02 NOTE — Addendum Note (Signed)
Addended by: Gordy Councilman on: 06/02/2017 10:01 AM   Modules accepted: Orders

## 2017-06-02 NOTE — Progress Notes (Signed)
Office Note 06/02/2017  CC:  Chief Complaint  Patient presents with  . Annual Exam    Pt is fasting.     HPI:  Gary Collins is a 56 y.o. White male who is here for annual health maintenance exam.  Dr. Loanne Drilling did a bx on thyroid gland yesterday.  Eyes: no exam in last 2 yrs, no eye complaints. Dental: preventatives UTD. Exercise: minimal Diet: not working on anything.  No acute complaints.  Past Medical History:  Diagnosis Date  . Adenomatous colon polyp 11/18/13  . Allergy   . Gross hematuria 06/2013   Right kidney radiolucent stone believed to be uric acid stone, cysto normal:  potassium citrate ER 20 mEQ (urocit K) bid by urology  . History of kidney stones   . Hyperlipidemia, mixed 2017   TLC recommended  . Inguinal hernia recurrent unilateral 05/30/13   Small, right sided, containing adipose tissue (noted on CT scan for hematuria)  . 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 Pilonidal cyst with abscess 07/15/2011   Qualifier: Diagnosis of  By: Alveta Heimlich MD, Cornelia Copa    . 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  Followed by Dr. Loanne Drilling.   No changes on u/s f/u 09/2012.  Repeat u/s planned by Dr. Loanne Drilling as of 02/2017 f/u.    Past Surgical History:  Procedure Laterality Date  . BIOPSY THYROID    . COLONOSCOPY W/ POLYPECTOMY     Adenomatous: recall 3 yrs.  Marland Kitchen EXTRACORPOREAL SHOCK WAVE LITHOTRIPSY Right 10/17/2016   Procedure: RIGHT EXTRACORPOREAL SHOCK WAVE LITHOTRIPSY (ESWL);  Surgeon: Carolan Clines, MD;  Location: [facility];  Service: Urology;  Laterality: Right;  . KNEE ARTHROSCOPY     left    Family History  Problem Relation Age of Onset  . Cancer Father        bladder  . Colon cancer Neg Hx     Social History   Social History  . Marital status: Married    Spouse name: [name]  . Number of children: [name]  . Years of education: [name]   Occupational  History  . Not on file.   Social History Main Topics  . Smoking status: Never Smoker  . Smokeless tobacco: Never Used  . Alcohol use 7.2 oz/week    12 Cans of beer per week     Comment: 12 pack per week  . Drug use: No  . Sexual activity: Not on file   Other Topics Concern  . Not on file   Social History Narrative   Married, 55 y/o girl and 20 boy.   Occupation: Clinical biochemist (residential).   Orig from [place], [place].   Enjoys fishing.  Exercises 2-3 times per week: 30 min elliptical.  Also plays soccer.   No tob, occ beer, no drugs.          Outpatient Medications Prior to Visit  Medication Sig Dispense Refill  . Azelastine HCl 0.15 % SOLN USE 2 SPRAYS IN EACH NOSTRIL AT BEDTIME EACH NIGHT. 30 mL 11  . cetirizine (ZYRTEC) 10 MG tablet Take 10 mg by mouth daily.    . finasteride (PROPECIA) 1 MG tablet TAKE 1 TABLET BY MOUTH ONCE DAILY 30 tablet 12  . Omega-3 Fatty Acids (FISH OIL PO) Take 4 tablets by mouth daily.     No facility-administered medications prior to visit.     Allergies  Allergen Reactions  . Beta Adrenergic Blockers Other (See Comments)    Not good for me per dr.  . Ibuprofen     REACTION: lip swelling  . Nsaids Other (See Comments)    Lip swelling    ROS Review of Systems  Constitutional: Negative for appetite change, chills, fatigue and fever.  HENT: Negative for congestion, dental problem, ear pain and sore throat.   Eyes: Negative for discharge, redness and visual disturbance.  Respiratory: Negative for cough, chest tightness, shortness of breath and wheezing.   Cardiovascular: Negative for chest pain, palpitations and leg swelling.  Gastrointestinal: Negative for abdominal pain, blood in stool, diarrhea, nausea and vomiting.  Genitourinary: Negative for difficulty urinating, dysuria, flank pain, frequency, hematuria and urgency.  Musculoskeletal: Negative for arthralgias, back pain, joint swelling, myalgias and neck stiffness.  Skin:  Negative for pallor and rash.  Neurological: Negative for dizziness, speech difficulty, weakness and headaches.  Hematological: Negative for adenopathy. Does not bruise/bleed easily.  Psychiatric/Behavioral: Negative for confusion and sleep disturbance. The patient is not nervous/anxious.     PE; Blood pressure 110/71, pulse (!) 57, temperature 98.1 F (36.7 C), temperature source Oral, resp. rate 16, height 6' (1.829 m), weight 242 lb 8 oz (110 kg), SpO2 97 %. Gen: Alert, well appearing.  Patient is oriented to person, place, time, and situation. AFFECT: pleasant, lucid thought and speech. ENT: Ears: EACs clear, normal epithelium.  TMs with good light reflex and landmarks bilaterally.  Eyes: no injection, icteris, swelling, or exudate.  EOMI, PERRLA. Nose: no drainage or turbinate edema/swelling.  No injection or focal lesion.  Mouth: lips without lesion/swelling.  Oral mucosa pink and moist.  Dentition intact and without obvious caries or gingival swelling.  Oropharynx without erythema, exudate, or swelling.  Neck: supple/nontender.  No LAD, mass, or TM.  Carotid pulses 2+ bilaterally, without bruits. CV: RRR, no m/r/g.   LUNGS: CTA bilat, nonlabored resps, good aeration in all lung fields. ABD: soft, NT, ND, BS normal.  No hepatospenomegaly or mass.  No bruits. EXT: no clubbing, cyanosis, or edema.  Musculoskeletal: no joint swelling, erythema, warmth, or tenderness.  ROM of all joints intact. Skin - no sores or suspicious lesions or rashes or color changes Rectal exam: negative without mass, lesions or tenderness, PROSTATE EXAM: smooth and symmetric without nodules or tenderness   Pertinent labs:  Lab Results  Component Value Date   TSH 1.49 02/12/2016   Lab Results  Component Value Date   WBC 6.5 02/12/2016   HGB 14.8 02/12/2016   HCT 43.3 02/12/2016   MCV 87.9 02/12/2016   PLT 213.0 02/12/2016   Lab Results  Component Value Date   CREATININE 0.95 02/12/2016   BUN 14  02/12/2016   NA 138 02/12/2016   K 4.1 02/12/2016   CL 104 02/12/2016   CO2 26 02/12/2016   Lab Results  Component Value Date   ALT 14 02/12/2016   AST 25 02/12/2016   ALKPHOS 61 02/12/2016   BILITOT 1.0 02/12/2016   Lab Results  Component Value Date   CHOL 220 (H) 02/12/2016   Lab Results  Component Value Date   HDL 34.10 (L) 02/12/2016   No results found for: [facility] Lab Results  Component Value Date   TRIG 203.0 (H) 02/12/2016   Lab Results  Component Value Date   CHOLHDL 6 02/12/2016   Lab Results  Component Value Date   PSA 1.07 02/12/2016   PSA 0.59 11/07/2013   PSA 0.94 10/18/2012  ASSESSMENT AND PLAN:   Health maintenance exam: Reviewed age and gender appropriate health maintenance issues (prudent diet, regular exercise, health risks of tobacco and excessive alcohol, use of seatbelts, fire alarms in home, use of sunscreen).  Also reviewed age and gender appropriate health screening as well as vaccine recommendations. Vaccines: UTD Labs: Fasting HP today. Prostate ca screening: DRE normal today, PSA today. Colon ca screening/hx of adenomatous polyp: recall was 11/2016 (Dr. Laurette Schimke has this scheduled for 07/10/17.  An After Visit Summary was printed and given to the patient.  FOLLOW UP:  Return in about 1 year (around 06/02/2018) for annual CPE (fasting).  Signed:  Crissie Sickles, MD           06/02/2017

## 2017-06-02 NOTE — Patient Instructions (Signed)

## 2017-06-05 ENCOUNTER — Other Ambulatory Visit (INDEPENDENT_AMBULATORY_CARE_PROVIDER_SITE_OTHER): Payer: 59

## 2017-06-05 DIAGNOSIS — Z0000 Encounter for general adult medical examination without abnormal findings: Secondary | ICD-10-CM | POA: Diagnosis not present

## 2017-06-05 DIAGNOSIS — Z125 Encounter for screening for malignant neoplasm of prostate: Secondary | ICD-10-CM | POA: Diagnosis not present

## 2017-06-05 LAB — COMPREHENSIVE METABOLIC PANEL
ALK PHOS: 61 U/L (ref 39–117)
ALT: 11 U/L (ref 0–53)
AST: 22 U/L (ref 0–37)
Albumin: 4.8 g/dL (ref 3.5–5.2)
BUN: 13 mg/dL (ref 6–23)
CALCIUM: 9.6 mg/dL (ref 8.4–10.5)
CO2: 31 mEq/L (ref 19–32)
Chloride: 103 mEq/L (ref 96–112)
Creatinine, Ser: 0.94 mg/dL (ref 0.40–1.50)
GFR: 88.59 mL/min (ref >60.00)
GLUCOSE: 104 mg/dL — AB (ref 70–99)
POTASSIUM: 4.4 meq/L (ref 3.5–5.1)
Sodium: 138 mEq/L (ref 135–145)
TOTAL PROTEIN: 7.7 g/dL (ref 6.0–8.3)
Total Bilirubin: 0.8 mg/dL (ref 0.2–1.2)

## 2017-06-05 LAB — CBC WITH DIFFERENTIAL/PLATELET
BASOS PCT: 0.9 % (ref 0.0–3.0)
Basophils Absolute: 0.1 10*3/uL (ref 0.0–0.1)
Eosinophils Absolute: 0.2 10*3/uL (ref 0.0–0.7)
Eosinophils Relative: 2.4 % (ref 0.0–5.0)
HCT: 44.1 % (ref 39.0–52.0)
Hemoglobin: 14.8 g/dL (ref 13.0–17.0)
LYMPHS ABS: 2 10*3/uL (ref 0.7–4.0)
Lymphocytes Relative: 31.7 % (ref 12.0–46.0)
MCHC: 33.5 g/dL (ref 30.0–36.0)
MCV: 91.1 fl (ref 78.0–100.0)
MONO ABS: 0.6 10*3/uL (ref 0.1–1.0)
Monocytes Relative: 8.8 % (ref 3.0–12.0)
NEUTROS ABS: 3.6 10*3/uL (ref 1.4–7.7)
NEUTROS PCT: 56.2 % (ref 43.0–77.0)
PLATELETS: 205 10*3/uL (ref 150.0–400.0)
RBC: 4.84 Mil/uL (ref 4.22–5.81)
RDW: 13.2 % (ref 11.5–15.5)
WBC: 6.5 10*3/uL (ref 4.0–10.5)

## 2017-06-05 LAB — TSH: TSH: 2.76 u[IU]/mL (ref 0.35–4.50)

## 2017-06-05 LAB — PSA: PSA: 0.56 ng/mL (ref 0.10–4.00)

## 2017-06-06 ENCOUNTER — Encounter: Payer: Self-pay | Admitting: Family Medicine

## 2017-06-06 LAB — LIPID PANEL
CHOLESTEROL: 204 mg/dL — AB (ref <200)
HDL: 32 mg/dL — ABNORMAL LOW (ref >40)
LDL Cholesterol: 129 mg/dL — ABNORMAL HIGH (ref <100)
TRIGLYCERIDES: 217 mg/dL — AB (ref <150)
Total CHOL/HDL Ratio: 6.4 Ratio — ABNORMAL HIGH (ref <5.0)
VLDL: 43 mg/dL — ABNORMAL HIGH (ref <30)

## 2017-07-06 ENCOUNTER — Encounter: Payer: Self-pay | Admitting: Gastroenterology

## 2017-07-06 ENCOUNTER — Ambulatory Visit (AMBULATORY_SURGERY_CENTER): Payer: Self-pay | Admitting: *Deleted

## 2017-07-06 VITALS — Ht 72.0 in | Wt 241.0 lb

## 2017-07-06 DIAGNOSIS — Z86010 Personal history of colonic polyps: Secondary | ICD-10-CM

## 2017-07-06 MED ORDER — NA SULFATE-K SULFATE-MG SULF 17.5-3.13-1.6 GM/177ML PO SOLN: ORAL | 0 refills | Status: DC

## 2017-07-06 NOTE — Progress Notes (Signed)
Patient denies any allergies to eggs or soy. Patient denies any problems with anesthesia/sedation. Patient denies any oxygen use at home. Patient denies taking any diet/weight loss medications or blood thinners. EMMI education assisgned to patient on colonoscopy, this was explained and instructions given to patient. 

## 2017-07-10 ENCOUNTER — Encounter: Payer: Self-pay | Admitting: Gastroenterology

## 2017-07-10 ENCOUNTER — Ambulatory Visit (AMBULATORY_SURGERY_CENTER): Payer: 59 | Admitting: Gastroenterology

## 2017-07-10 VITALS — BP 137/74 | HR 84 | Temp 98.7°F | Resp 15 | Ht 72.0 in | Wt 242.0 lb

## 2017-07-10 DIAGNOSIS — D124 Benign neoplasm of descending colon: Secondary | ICD-10-CM | POA: Diagnosis not present

## 2017-07-10 DIAGNOSIS — D128 Benign neoplasm of rectum: Secondary | ICD-10-CM | POA: Diagnosis not present

## 2017-07-10 DIAGNOSIS — Z86010 Personal history of colonic polyps: Secondary | ICD-10-CM

## 2017-07-10 DIAGNOSIS — K635 Polyp of colon: Secondary | ICD-10-CM | POA: Diagnosis not present

## 2017-07-10 DIAGNOSIS — D120 Benign neoplasm of cecum: Secondary | ICD-10-CM

## 2017-07-10 DIAGNOSIS — D129 Benign neoplasm of anus and anal canal: Secondary | ICD-10-CM

## 2017-07-10 HISTORY — PX: COLONOSCOPY W/ POLYPECTOMY: SHX1380

## 2017-07-10 MED ORDER — SODIUM CHLORIDE 0.9 % IV SOLN: 500.0000 mL | INTRAVENOUS | Status: DC

## 2017-07-10 NOTE — Progress Notes (Signed)
Report to PACU, RN, vss, BBS= Clear.  

## 2017-07-10 NOTE — Op Note (Signed)
[place] Patient Name: Gary Collins Procedure Date: 07/10/2017 8:48 AM MRN: [id number] Endoscopist: Milus Banister , MD Age: 56 Referring MD:  Date of Birth: Sep 13, 1961 Gender: Male Account #: [id number] Procedure:                Colonoscopy Indications:              High risk colon cancer surveillance: Personal                            history of colonic polyps; colonoscopy 2015: 2                            subCM adenomas, 1 subCM SSP Medicines:                Monitored Anesthesia Care Procedure:                Pre-Anesthesia Assessment:                           - Prior to the procedure, a History and Physical                            was performed, and patient medications and                            allergies were reviewed. The patient's tolerance of                            previous anesthesia was also reviewed. The risks                            and benefits of the procedure and the sedation                            options and risks were discussed with the patient.                            All questions were answered, and informed consent                            was obtained. Prior Anticoagulants: The patient has                            taken no previous anticoagulant or antiplatelet                            agents. ASA Grade Assessment: II - A patient with                            mild systemic disease. After reviewing the risks                            and benefits, the patient was deemed in  satisfactory condition to undergo the procedure.                           After obtaining informed consent, the colonoscope                            was passed under direct vision. Throughout the                            procedure, the patient's blood pressure, pulse, and                            oxygen saturations were monitored continuously. The                            Model CF-HQ190L [id number]) scope was  introduced                            through the anus and advanced to the the cecum,                            identified by appendiceal orifice and ileocecal                            valve. The colonoscopy was performed without                            difficulty. The patient tolerated the procedure                            well. The quality of the bowel preparation was                            excellent. The ileocecal valve, appendiceal                            orifice, and rectum were photographed. Scope In: 8:57:25 AM Scope Out: 9:08:13 AM Scope Withdrawal Time: 0 hours 9 minutes 26 seconds  Total Procedure Duration: 0 hours 10 minutes 48 seconds  Findings:                 Four sessile polyps were found in the rectum,                            descending colon and cecum. The polyps were 2 to 4                            mm in size. These polyps were removed with a cold                            snare. Resection and retrieval were complete.                           The exam was otherwise without abnormality on  direct and retroflexion views. Complications:            No immediate complications. Estimated blood loss:                            None. Estimated Blood Loss:     Estimated blood loss: none. Impression:               - Four 2 to 4 mm polyps in the rectum, in the                            descending colon and in the cecum, removed with a                            cold snare. Resected and retrieved.                           - The examination was otherwise normal on direct                            and retroflexion views. Recommendation:           - Patient has a contact number available for                            emergencies. The signs and symptoms of potential                            delayed complications were discussed with the                            patient. Return to normal activities tomorrow.                             Written discharge instructions were provided to the                            patient.                           - Resume previous diet.                           - Continue present medications.                           You will receive a letter within 2-3 weeks with the                            pathology results and my final recommendations.                           If the polyp(s) is proven to be 'pre-cancerous' on                            pathology, you will need repeat colonoscopy in  3-5                            years. If the polyp(s) is NOT 'precancerous' on                            pathology then you should repeat colon cancer                            screening in 10 years with colonoscopy without need                            for colon cancer screening by any method prior to                            then (including stool testing) Milus Banister, MD 07/10/2017 9:10:27 AM This report has been signed electronically.

## 2017-07-10 NOTE — Patient Instructions (Signed)
YOU HAD AN ENDOSCOPIC PROCEDURE TODAY AT THE [facility]:   Refer to the procedure report that was given to you for any specific questions about what was found during the examination.  If the procedure report does not answer your questions, please call your gastroenterologist to clarify.  If you requested that your care partner not be given the details of your procedure findings, then the procedure report has been included in a sealed envelope for you to review at your convenience later.  YOU SHOULD EXPECT: Some feelings of bloating in the abdomen. Passage of more gas than usual.  Walking can help get rid of the air that was put into your GI tract during the procedure and reduce the bloating. If you had a lower endoscopy (such as a colonoscopy or flexible sigmoidoscopy) you may notice spotting of blood in your stool or on the toilet paper. If you underwent a bowel prep for your procedure, you may not have a normal bowel movement for a few days.  Please Note:  You might notice some irritation and congestion in your nose or some drainage.  This is from the oxygen used during your procedure.  There is no need for concern and it should clear up in a day or so.  SYMPTOMS TO REPORT IMMEDIATELY:   Following lower endoscopy (colonoscopy or flexible sigmoidoscopy):  Excessive amounts of blood in the stool  Significant tenderness or worsening of abdominal pains  Swelling of the abdomen that is new, acute  Fever of 100F or higher    For urgent or emergent issues, a gastroenterologist can be reached at any hour by calling [phone].   DIET:  We do recommend a small meal at first, but then you may proceed to your regular diet.  Drink plenty of fluids but you should avoid alcoholic beverages for 24 hours.  ACTIVITY:  You should plan to take it easy for the rest of today and you should NOT DRIVE or use heavy machinery until tomorrow (because of the sedation medicines used during the test).     FOLLOW UP: Our staff will call the number listed on your records the next business day following your procedure to check on you and address any questions or concerns that you may have regarding the information given to you following your procedure. If we do not reach you, we will leave a message.  However, if you are feeling well and you are not experiencing any problems, there is no need to return our call.  We will assume that you have returned to your regular daily activities without incident.  If any biopsies were taken you will be contacted by phone or by letter within the next 1-3 weeks.  Please call us at [phone] if you have not heard about the biopsies in 3 weeks.    SIGNATURES/CONFIDENTIALITY: You and/or your care partner have signed paperwork which will be entered into your electronic medical record.  These signatures attest to the fact that that the information above on your After Visit Summary has been reviewed and is understood.  Full responsibility of the confidentiality of this discharge information lies with you and/or your care-partner.   Resume medications. Information given on polyps. 

## 2017-07-10 NOTE — Progress Notes (Signed)
Called to room to assist during endoscopic procedure.  Patient ID and intended procedure confirmed with present staff. Received instructions for my participation in the procedure from the performing physician.  

## 2017-07-11 ENCOUNTER — Telehealth: Payer: Self-pay

## 2017-07-11 NOTE — Telephone Encounter (Signed)
  Follow up Call-  Call back number 07/10/2017  Post procedure Call Back phone  # [phone]  Permission to leave phone message Yes  Some recent data might be hidden     Patient questions:  Do you have a fever, pain , or abdominal swelling? No. Pain Score  0 *  Have you tolerated food without any problems? Yes.    Have you been able to return to your normal activities? Yes.    Do you have any questions about your discharge instructions: Diet   No. Medications  No. Follow up visit  No.  Do you have questions or concerns about your Care? No.  Actions: * If pain score is 4 or above: No action needed, pain <4.

## 2017-07-14 ENCOUNTER — Encounter: Payer: Self-pay | Admitting: Gastroenterology

## 2017-07-14 ENCOUNTER — Encounter: Payer: Self-pay | Admitting: Family Medicine

## 2017-08-13 DIAGNOSIS — Z23 Encounter for immunization: Secondary | ICD-10-CM | POA: Diagnosis not present

## 2017-12-03 ENCOUNTER — Ambulatory Visit (INDEPENDENT_AMBULATORY_CARE_PROVIDER_SITE_OTHER): Payer: Self-pay | Admitting: Emergency Medicine

## 2017-12-03 VITALS — BP 120/80 | HR 62 | Temp 99.0°F | Resp 16 | Wt 251.2 lb

## 2017-12-03 DIAGNOSIS — L0591 Pilonidal cyst without abscess: Secondary | ICD-10-CM

## 2017-12-03 MED ORDER — CLINDAMYCIN HCL 300 MG PO CAPS: 300.0000 mg | ORAL_CAPSULE | Freq: Three times a day (TID) | ORAL | 0 refills | Status: DC

## 2017-12-03 MED ORDER — MUPIROCIN 2 % EX OINT: 1.0000 "application " | TOPICAL_OINTMENT | Freq: Two times a day (BID) | CUTANEOUS | 0 refills | Status: DC

## 2017-12-03 NOTE — Progress Notes (Signed)
S: Gary Collins is a 57 y.o. male who presents for possible cyst/abscess on the left buttock. He has past history of pilonidal cyst, has been on antibiotics and has previously had to have his cyst drained around 10 years ago. He denies fever, chills, nausea, vomiting, or other systemic symptoms.  ROS: otherwise negative.  O: Vitals:   12/03/17 1050  BP: 120/80  Pulse: 62  Resp: 16  Temp: 99 F (37.2 C)  SpO2: 95%   Physical Exam  Constitutional: He appears well-developed and well-nourished. No distress.  Cardiovascular: Normal rate and regular rhythm.  Genitourinary:     Neurological: He is alert.  Skin: Skin is warm and dry. Capillary refill takes less than 2 seconds. No rash noted. He is not diaphoretic.  Nursing note and vitals reviewed.  A:  1. Cyst near tailbone    P: No cellulitis or induration, specific location if unclear so advise against I&D today, will cover with clindamycin and Muprocin, return if symptoms worsen or if induration develops for I&D

## 2017-12-03 NOTE — Patient Instructions (Signed)
Epidermal Cyst An epidermal cyst is a small, painless lump under your skin. It may be called an epidermal inclusion cyst or an infundibular cyst. The cyst contains a grayish-white, bad-smelling substance (keratin). It is important not to pop epidermal cysts yourself. These cysts are usually harmless (benign), but they can get infected. Symptoms of infection may include:  Redness.  Inflammation.  Tenderness.  Warmth.  Fever.  A grayish-white, bad-smelling substance draining from the cyst.  Pus draining from the cyst.  Follow these instructions at home:  Take over-the-counter and prescription medicines only as told by your doctor.  If you were prescribed an antibiotic, use it as told by your doctor. Do not stop using the antibiotic even if you start to feel better.  Keep the area around your cyst clean and dry.  Wear loose, dry clothing.  Do not try to pop your cyst.  Avoid touching your cyst.  Check your cyst every day for signs of infection.  Keep all follow-up visits as told by your doctor. This is important. How is this prevented?  Wear clean, dry, clothing.  Avoid wearing tight clothing.  Keep your skin clean and dry. Shower or take baths every day.  Wash your body with a benzoyl peroxide wash when you shower or bathe. Contact a health care provider if:  Your cyst has symptoms of infection.  Your condition is not improving or is getting worse.  You have a cyst that looks different from other cysts you have had.  You have a fever. Get help right away if:  Redness spreads from the cyst into the surrounding area. This information is not intended to replace advice given to you by your health care provider. Make sure you discuss any questions you have with your health care provider. Document Released: 11/03/2004 Document Revised: 05/25/2016 Document Reviewed: 07/29/2015 Elsevier Interactive Patient Education  2018 Elsevier Inc.  

## 2017-12-06 ENCOUNTER — Telehealth: Payer: Self-pay

## 2017-12-06 NOTE — Telephone Encounter (Signed)
Called and spoke with pt and pt states he is doing better.

## 2018-06-03 IMAGING — DX DG ABDOMEN 1V
2 series · 2 of 2 positions shown · non-contrast
Comparison: 09/12/2016

CLINICAL DATA: Right renal calculus.

EXAM:
ABDOMEN - 1 VIEW

[abdomen kub (1 of 2)]
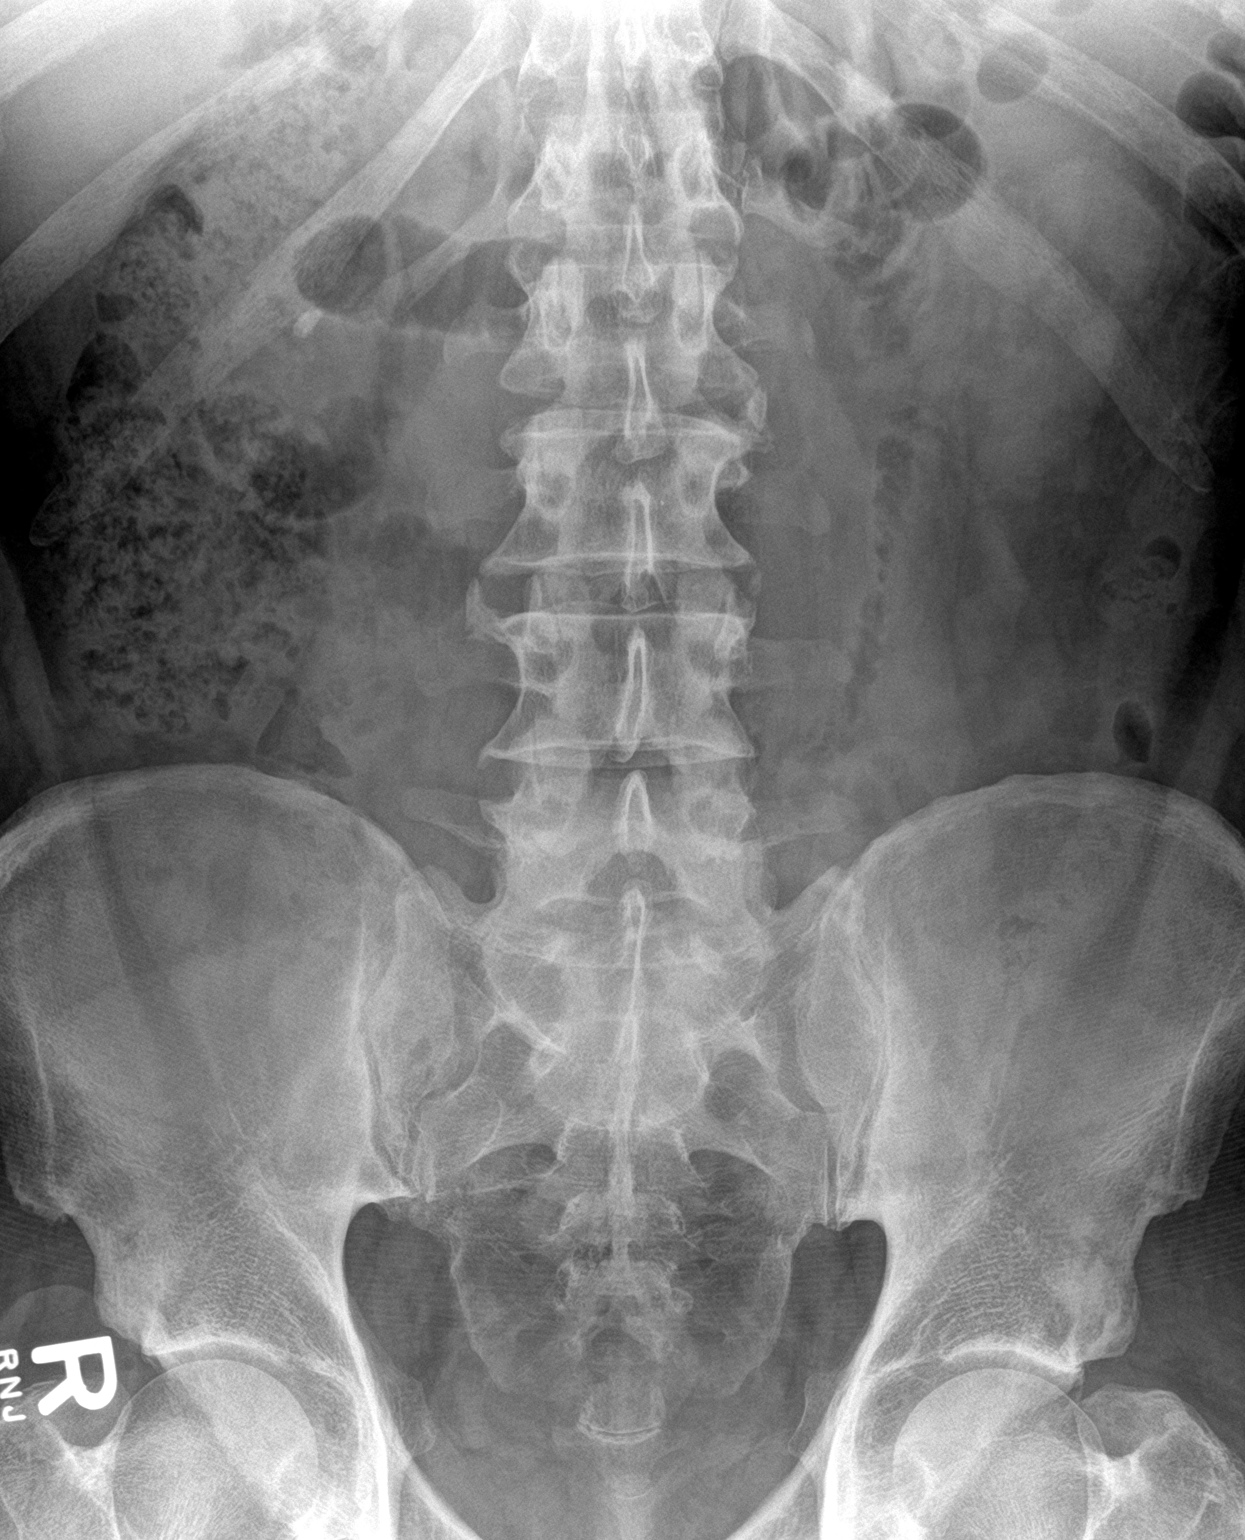

[abdomen kub (2 of 2)]
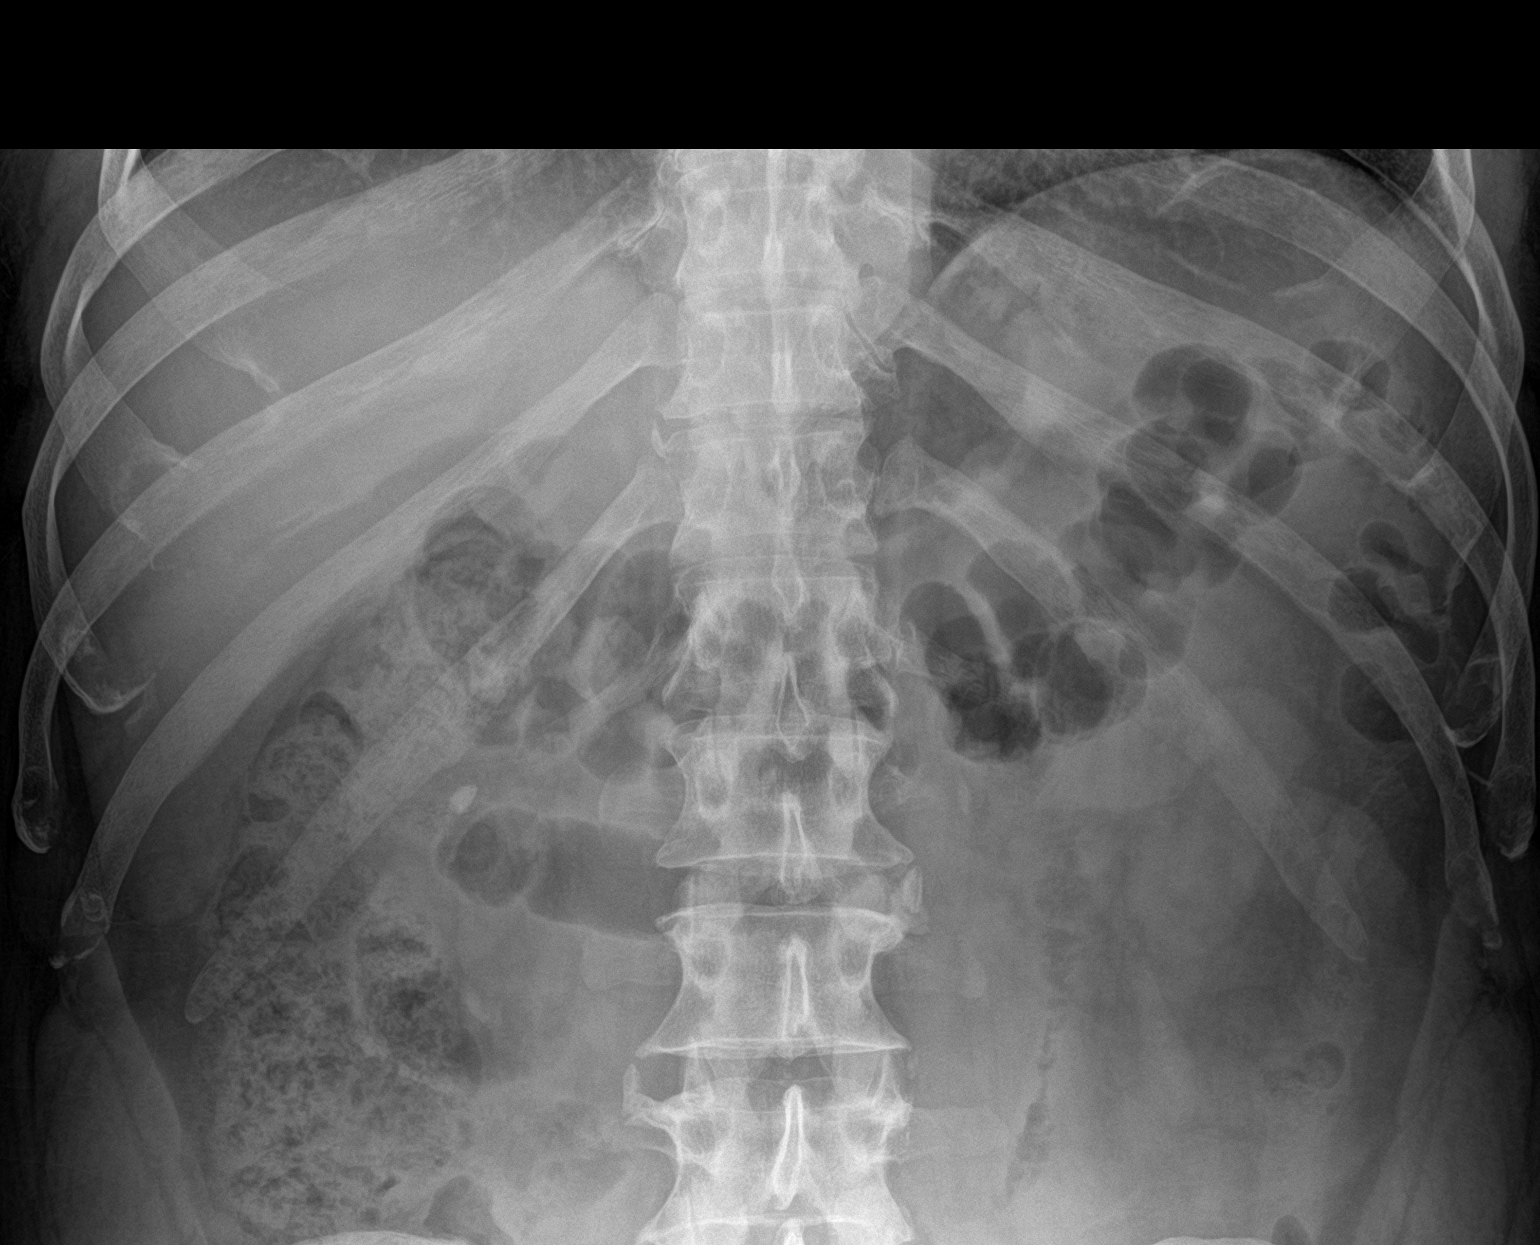

[2 of 2 positions shown; findings below may reference images not displayed]

FINDINGS: Residual lower pole renal calculus on the right measures
approximately 5 x 10 mm. No other definite calculi identified by
x-ray. The bowel gas pattern is unremarkable. The spine demonstrates
mild degenerative disease.
IMPRESSION: 5 x 10 mm right lower pole renal calculus.

## 2018-06-18 ENCOUNTER — Encounter: Payer: 59 | Admitting: Family Medicine

## 2018-07-02 DIAGNOSIS — L821 Other seborrheic keratosis: Secondary | ICD-10-CM | POA: Diagnosis not present

## 2018-07-02 DIAGNOSIS — D485 Neoplasm of uncertain behavior of skin: Secondary | ICD-10-CM | POA: Diagnosis not present

## 2018-07-02 DIAGNOSIS — L814 Other melanin hyperpigmentation: Secondary | ICD-10-CM | POA: Diagnosis not present

## 2018-07-02 DIAGNOSIS — D2270 Melanocytic nevi of unspecified lower limb, including hip: Secondary | ICD-10-CM | POA: Diagnosis not present

## 2018-07-02 DIAGNOSIS — D225 Melanocytic nevi of trunk: Secondary | ICD-10-CM | POA: Diagnosis not present

## 2018-07-16 ENCOUNTER — Ambulatory Visit (INDEPENDENT_AMBULATORY_CARE_PROVIDER_SITE_OTHER): Payer: 59 | Admitting: Family Medicine

## 2018-07-16 ENCOUNTER — Encounter: Payer: Self-pay | Admitting: Family Medicine

## 2018-07-16 VITALS — BP 127/73 | HR 55 | Temp 98.7°F | Resp 16 | Ht 72.0 in | Wt 247.5 lb

## 2018-07-16 DIAGNOSIS — E669 Obesity, unspecified: Secondary | ICD-10-CM | POA: Diagnosis not present

## 2018-07-16 DIAGNOSIS — Z0000 Encounter for general adult medical examination without abnormal findings: Secondary | ICD-10-CM | POA: Diagnosis not present

## 2018-07-16 DIAGNOSIS — Z125 Encounter for screening for malignant neoplasm of prostate: Secondary | ICD-10-CM

## 2018-07-16 DIAGNOSIS — Z23 Encounter for immunization: Secondary | ICD-10-CM

## 2018-07-16 DIAGNOSIS — E7800 Pure hypercholesterolemia, unspecified: Secondary | ICD-10-CM | POA: Diagnosis not present

## 2018-07-16 NOTE — Patient Instructions (Signed)

## 2018-07-16 NOTE — Addendum Note (Signed)
Addended by: Onalee Hua on: 07/16/2018 10:47 AM   Modules accepted: Orders

## 2018-07-16 NOTE — Addendum Note (Signed)
Addended by: Ralph Dowdy on: 07/16/2018 10:37 AM   Modules accepted: Orders

## 2018-07-16 NOTE — Progress Notes (Signed)
Office Note 07/16/2018  CC:  Chief Complaint  Patient presents with  . Annual Exam    pt is fasting.     HPI:  Gary Collins is a 57 y.o. White male who is annual health maintenance exam. Busy, works as a Equities trader at [employer]. Plays soccer sometimes.  Walking with wife, rides bikes. Diet: working on reducing carbs/simple sugars.  Dental exams UTD.  Feeling well, no acute complaints.   Past Medical History:  Diagnosis Date  . Adenomatous colon polyp 11/18/13; 07/10/17   Recall 3-5 yrs  . Allergy   . Gross hematuria 06/2013   Right kidney radiolucent stone believed to be uric acid stone, cysto normal:  potassium citrate ER 20 mEQ (urocit K) bid by urology  . History of kidney stones   . Hyperlipidemia, mixed 2017   TLC recommended 2017 and 2018  . Inguinal hernia recurrent unilateral 05/30/13   Small, right sided, containing adipose tissue (noted on CT scan for hematuria)  . 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 Pilonidal cyst with abscess 07/15/2011   Qualifier: Diagnosis of  By: Alveta Heimlich MD, Cornelia Copa    . 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  Followed by Dr. Loanne Drilling.   No changes on u/s f/u 09/2012.  Repeat u/s planned by Dr. Loanne Drilling as of 02/2017 f/u.    Past Surgical History:  Procedure Laterality Date  . BIOPSY THYROID    . COLONOSCOPY W/ POLYPECTOMY  07/10/2017   Adenomatous: recall 3-5 yrs  . EXTRACORPOREAL SHOCK WAVE LITHOTRIPSY Right 10/17/2016   Procedure: RIGHT EXTRACORPOREAL SHOCK WAVE LITHOTRIPSY (ESWL);  Surgeon: Carolan Clines, MD;  Location: [facility];  Service: Urology;  Laterality: Right;  . KNEE ARTHROSCOPY     left    Family History  Problem Relation Age of Onset  . Cancer Father        bladder  . Colon cancer Neg Hx   . Stomach cancer Neg Hx     Social History   Socioeconomic History  . Marital status: Married     Spouse name: Not on file  . Number of children: Not on file  . Years of education: Not on file  . Highest education level: Not on file  Occupational History  . Not on file  Social Needs  . Financial resource strain: Not on file  . Food insecurity:    Worry: Not on file    Inability: Not on file  . Transportation needs:    Medical: Not on file    Non-medical: Not on file  Tobacco Use  . Smoking status: Never Smoker  . Smokeless tobacco: Never Used  Substance and Sexual Activity  . Alcohol use: Yes    Alcohol/week: 12.0 - 18.0 standard drinks    Types: 12 - 18 Cans of beer per week    Comment: 12-18 beers per week  . Drug use: No  . Sexual activity: Not on file  Lifestyle  . Physical activity:    Days per week: Not on file    Minutes per session: Not on file  . Stress: Not on file  Relationships  . Social connections:    Talks on phone: Not on file    Gets together: Not on file    Attends religious service: Not on file    Active member of club or organization: Not on file  Attends meetings of clubs or organizations: Not on file    Relationship status: Not on file  . Intimate partner violence:    Fear of current or ex partner: Not on file    Emotionally abused: Not on file    Physically abused: Not on file    Forced sexual activity: Not on file  Other Topics Concern  . Not on file  Social History Narrative   Married, 56 y/o girl and 65 boy.   Occupation: Clinical biochemist (residential).   Orig from [place], [place].   Enjoys fishing.  Exercises 2-3 times per week: 30 min elliptical.  Also plays soccer.   No tob, occ beer, no drugs.          Outpatient Medications Prior to Visit  Medication Sig Dispense Refill  . cetirizine (ZYRTEC) 10 MG tablet Take 10 mg by mouth daily.    . finasteride (PROPECIA) 1 MG tablet TAKE 1 TABLET BY MOUTH ONCE DAILY 30 tablet 12  . Omega-3 Fatty Acids (FISH OIL PO) Take 4 tablets by mouth daily.    . Azelastine HCl 0.15 % SOLN  USE 2 SPRAYS IN EACH NOSTRIL AT BEDTIME EACH NIGHT. (Patient not taking: Reported on 12/03/2017) 30 mL 11  . clindamycin (CLEOCIN) 300 MG capsule Take 1 capsule (300 mg total) by mouth 3 (three) times daily. (Patient not taking: Reported on 07/16/2018) 30 capsule 0  . mupirocin ointment (BACTROBAN) 2 % Apply 1 application topically 2 (two) times daily. (Patient not taking: Reported on 07/16/2018) 30 g 0   No facility-administered medications prior to visit.     Allergies  Allergen Reactions  . Beta Adrenergic Blockers Other (See Comments)    Not good for me per dr.  . Ibuprofen     REACTION: lip swelling  . Nsaids Other (See Comments)    Lip swelling    ROS Review of Systems  Constitutional: Negative for appetite change, chills, fatigue and fever.  HENT: Negative for congestion, dental problem, ear pain and sore throat.   Eyes: Negative for discharge, redness and visual disturbance.  Respiratory: Negative for cough, chest tightness, shortness of breath and wheezing.   Cardiovascular: Negative for chest pain, palpitations and leg swelling.  Gastrointestinal: Negative for abdominal pain, blood in stool, diarrhea, nausea and vomiting.  Genitourinary: Negative for difficulty urinating, dysuria, flank pain, frequency, hematuria and urgency.  Musculoskeletal: Negative for arthralgias, back pain, joint swelling, myalgias and neck stiffness.  Skin: Negative for pallor and rash.  Neurological: Negative for dizziness, speech difficulty, weakness and headaches.  Hematological: Negative for adenopathy. Does not bruise/bleed easily.  Psychiatric/Behavioral: Negative for confusion and sleep disturbance. The patient is not nervous/anxious.     PE; Blood pressure 127/73, pulse (!) 55, temperature 98.7 F (37.1 C), temperature source Oral, resp. rate 16, height 6' (1.829 m), weight 247 lb 8 oz (112.3 kg), SpO2 97 %. Body mass index is 33.57 kg/m.  Gen: Alert, well appearing.  Patient is oriented to  person, place, time, and situation. AFFECT: pleasant, lucid thought and speech. ENT: Ears: EACs clear, normal epithelium.  TMs with good light reflex and landmarks bilaterally.  Eyes: no injection, icteris, swelling, or exudate.  EOMI, PERRLA. Nose: no drainage or turbinate edema/swelling.  No injection or focal lesion.  Mouth: lips without lesion/swelling.  Oral mucosa pink and moist.  Dentition intact and without obvious caries or gingival swelling.  Oropharynx without erythema, exudate, or swelling.  Neck: supple/nontender.  No LAD, mass, or TM.  Carotid pulses  2+ bilaterally, without bruits. CV: RRR, no m/r/g.   LUNGS: CTA bilat, nonlabored resps, good aeration in all lung fields. ABD: soft, NT, ND, BS normal.  No hepatospenomegaly or mass.  No bruits. EXT: no clubbing, cyanosis, or edema.  Musculoskeletal: no joint swelling, erythema, warmth, or tenderness.  ROM of all joints intact. Skin - no sores or suspicious lesions or rashes or color changes Rectal exam: negative without mass, lesions or tenderness, PROSTATE EXAM: smooth and symmetric without nodules or tenderness.   Pertinent labs:  Lab Results  Component Value Date   TSH 2.76 06/05/2017   Lab Results  Component Value Date   WBC 6.5 06/05/2017   HGB 14.8 06/05/2017   HCT 44.1 06/05/2017   MCV 91.1 06/05/2017   PLT 205.0 06/05/2017   Lab Results  Component Value Date   CREATININE 0.94 06/05/2017   BUN 13 06/05/2017   NA 138 06/05/2017   K 4.4 06/05/2017   CL 103 06/05/2017   CO2 31 06/05/2017   Lab Results  Component Value Date   ALT 11 06/05/2017   AST 22 06/05/2017   ALKPHOS 61 06/05/2017   BILITOT 0.8 06/05/2017   Lab Results  Component Value Date   CHOL 204 (H) 06/05/2017   Lab Results  Component Value Date   HDL 32 (L) 06/05/2017   Lab Results  Component Value Date   LDLCALC 129 (H) 06/05/2017   Lab Results  Component Value Date   TRIG 217 (H) 06/05/2017   Lab Results  Component Value Date    CHOLHDL 6.4 (H) 06/05/2017   Lab Results  Component Value Date   PSA 0.56 06/05/2017   PSA 1.07 02/12/2016   PSA 0.59 11/07/2013   No results found for: HGBA1C   ASSESSMENT AND PLAN:   Health maintenance exam: Reviewed age and gender appropriate health maintenance issues (prudent diet, regular exercise, health risks of tobacco and excessive alcohol, use of seatbelts, fire alarms in home, use of sunscreen).  Also reviewed age and gender appropriate health screening as well as vaccine recommendations. Vaccines:  Flu vaccine--> given today.   Shingrix-->#1 today. Labs: fasting HP + PSA. Prostate ca screening: DRE normal today , PSA. Colon ca screening:  Hx of adenomatous polyps; next colonoscopy due 2021-2023 range (3-5 yrs).  An After Visit Summary was printed and given to the patient.  FOLLOW UP:  Return for Nurse appt for shingrix #2 in 2 mo.  CPE with me in 1 yr (fasting).  Signed:  Crissie Sickles, MD           07/16/2018

## 2018-07-17 ENCOUNTER — Encounter: Payer: Self-pay | Admitting: *Deleted

## 2018-07-17 ENCOUNTER — Encounter: Payer: Self-pay | Admitting: Family Medicine

## 2018-07-17 LAB — COMPREHENSIVE METABOLIC PANEL
AG RATIO: 1.5 (calc) (ref 1.0–2.5)
ALT: 12 U/L (ref 9–46)
AST: 21 U/L (ref 10–35)
Albumin: 4.4 g/dL (ref 3.6–5.1)
Alkaline phosphatase (APISO): 63 U/L (ref 40–115)
BUN: 13 mg/dL (ref 7–25)
CHLORIDE: 101 mmol/L (ref 98–110)
CO2: 23 mmol/L (ref 20–32)
CREATININE: 0.9 mg/dL (ref 0.70–1.33)
Calcium: 9.2 mg/dL (ref 8.6–10.3)
GLOBULIN: 3 g/dL (ref 1.9–3.7)
GLUCOSE: 91 mg/dL (ref 65–99)
Potassium: 4.1 mmol/L (ref 3.5–5.3)
Sodium: 137 mmol/L (ref 135–146)
TOTAL PROTEIN: 7.4 g/dL (ref 6.1–8.1)
Total Bilirubin: 0.9 mg/dL (ref 0.2–1.2)

## 2018-07-17 LAB — CBC WITH DIFFERENTIAL/PLATELET
BASOS PCT: 1 %
Basophils Absolute: 78 cells/uL (ref 0–200)
EOS PCT: 1.7 %
Eosinophils Absolute: 133 cells/uL (ref 15–500)
HCT: 41.7 % (ref 38.5–50.0)
HEMOGLOBIN: 14.6 g/dL (ref 13.2–17.1)
Lymphs Abs: 1989 cells/uL (ref 850–3900)
MCH: 30 pg (ref 27.0–33.0)
MCHC: 35 g/dL (ref 32.0–36.0)
MCV: 85.8 fL (ref 80.0–100.0)
MONOS PCT: 7.6 %
MPV: 12 fL (ref 7.5–12.5)
NEUTROS ABS: 5008 {cells}/uL (ref 1500–7800)
Neutrophils Relative %: 64.2 %
Platelets: 217 10*3/uL (ref 140–400)
RBC: 4.86 10*6/uL (ref 4.20–5.80)
RDW: 12.5 % (ref 11.0–15.0)
Total Lymphocyte: 25.5 %
WBC mixed population: 593 cells/uL (ref 200–950)
WBC: 7.8 10*3/uL (ref 3.8–10.8)

## 2018-07-17 LAB — LIPID PANEL
Cholesterol: 193 mg/dL (ref <200)
HDL: 36 mg/dL — AB (ref >40)
LDL Cholesterol (Calc): 120 mg/dL (calc) — ABNORMAL HIGH
NON-HDL CHOLESTEROL (CALC): 157 mg/dL — AB (ref <130)
TRIGLYCERIDES: 246 mg/dL — AB (ref <150)
Total CHOL/HDL Ratio: 5.4 (calc) — ABNORMAL HIGH (ref <5.0)

## 2018-07-17 LAB — TSH: TSH: 1.98 mIU/L (ref 0.40–4.50)

## 2018-07-17 LAB — PSA: PSA: 0.8 ng/mL (ref <=4.0)

## 2018-07-22 IMAGING — US US THYROID BIOPSY
1 series · 13 of 15 positions shown · non-contrast
Comparison: 02/24/2017

MEDICATIONS:
1% lidocaine locally

COMPLICATIONS:
None immediate.

INDICATION: Indeterminate thyroid nodule

EXAM:
ULTRASOUND GUIDED FINE NEEDLE ASPIRATION OF INDETERMINATE THYROID
NODULE
TECHNIQUE: Informed written consent was obtained from the patient after a
discussion of the risks, benefits and alternatives to treatment.
Questions regarding the procedure were encouraged and answered. A
timeout was performed prior to the initiation of the procedure.

[Series 1: us thyroid biopsy · 0.09mm/px · 15 acquisitions, 13 frames shown]
[im 1/15]
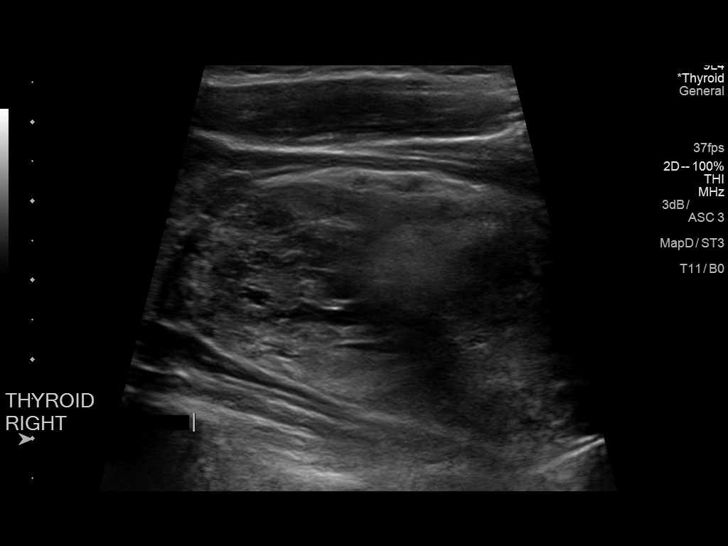
[im 2/15]
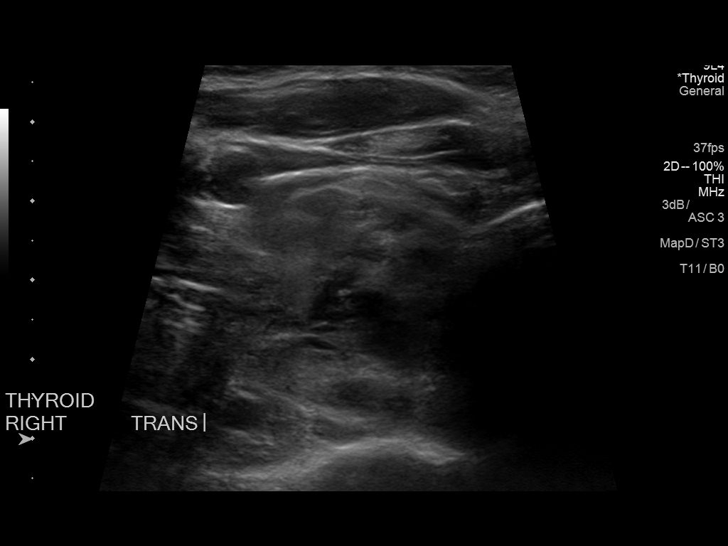
[im 3/15]
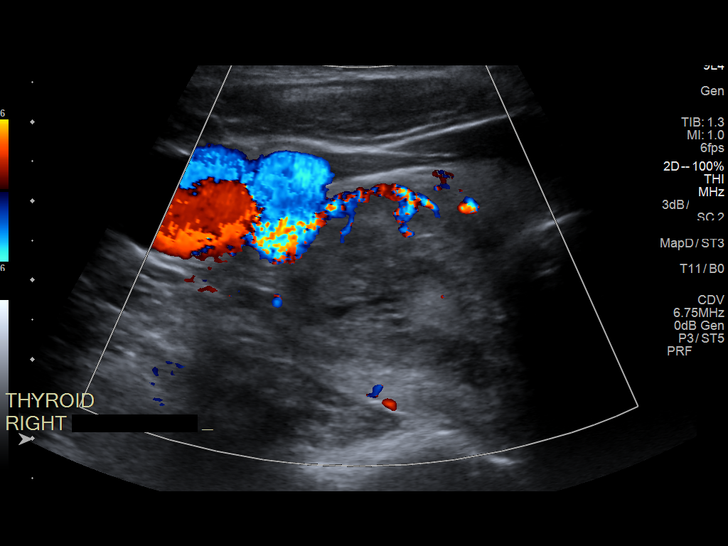
[im 5/15]
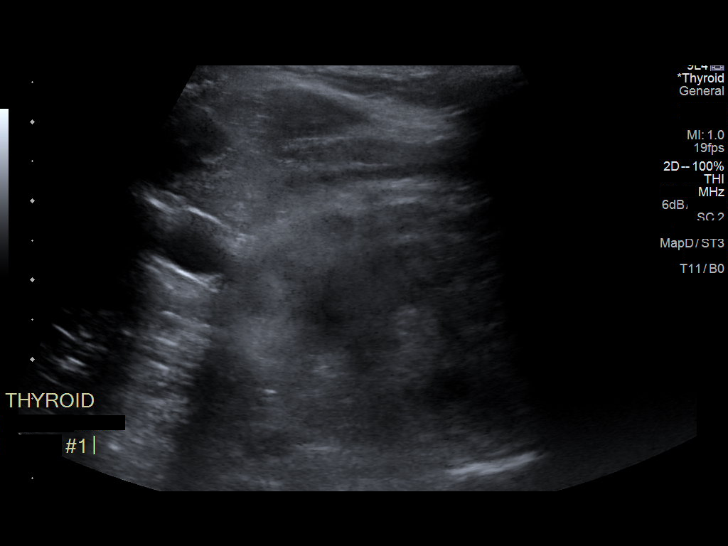
[im 6/15]
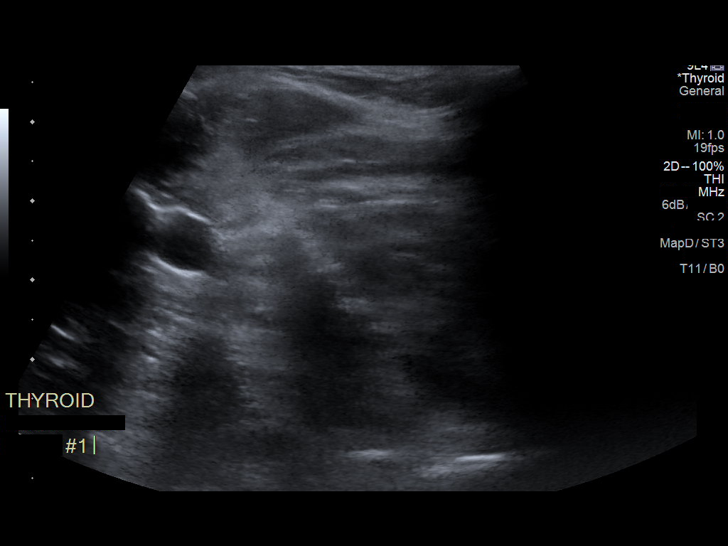
[im 7/15]
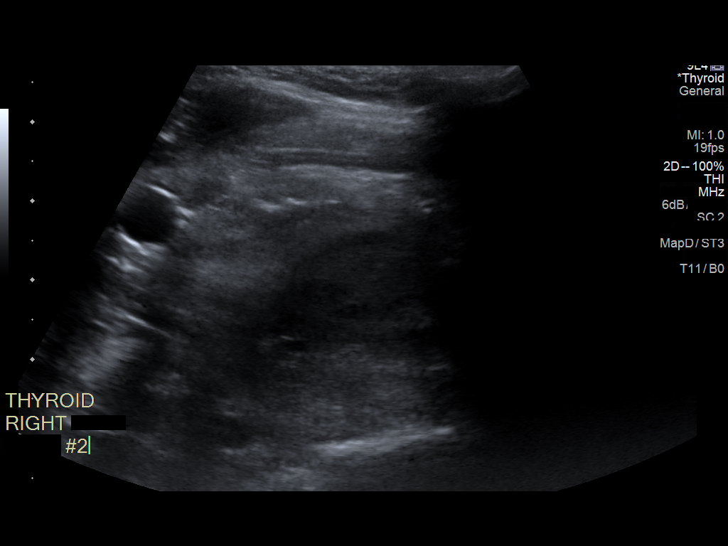
[im 8/15]
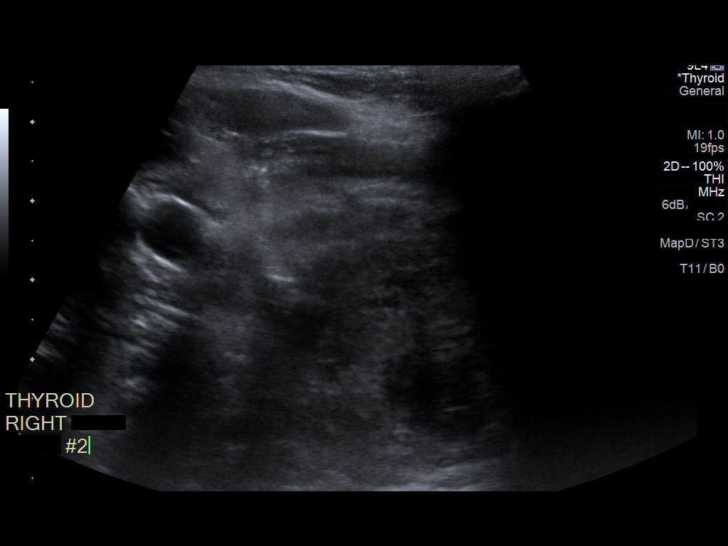
[im 9/15]
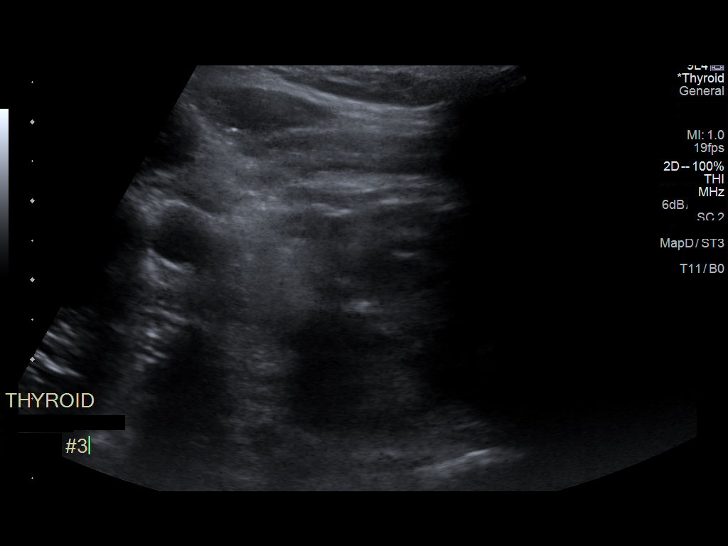
[im 10/15]
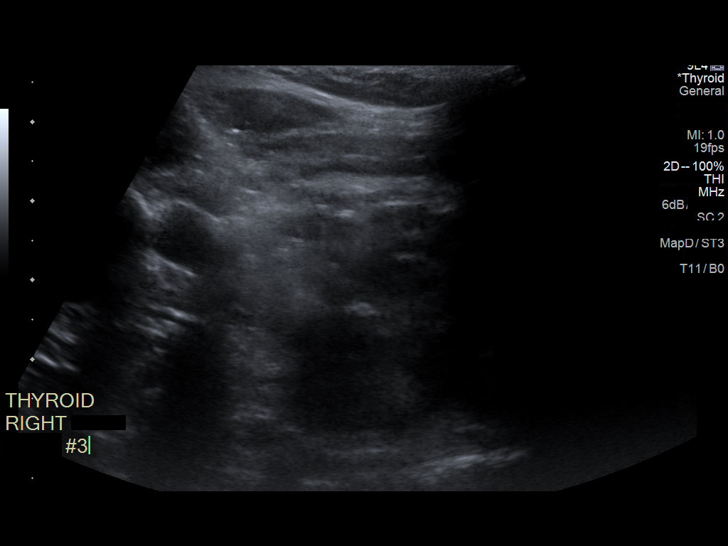
[im 11/15]
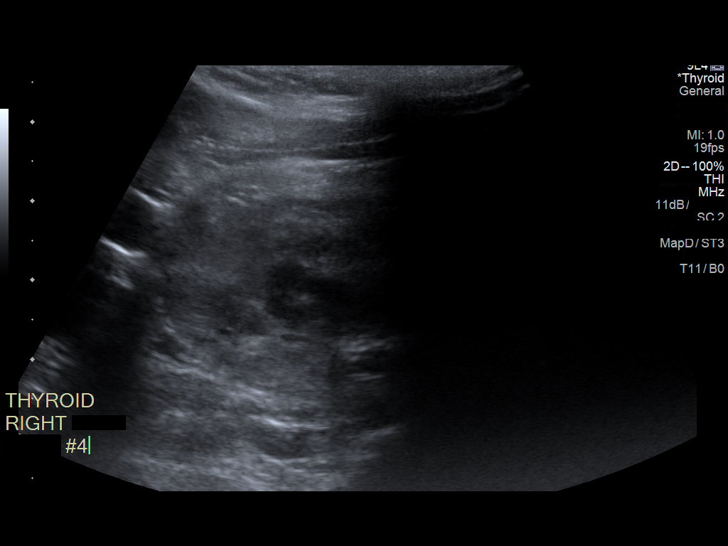
[im 13/15]
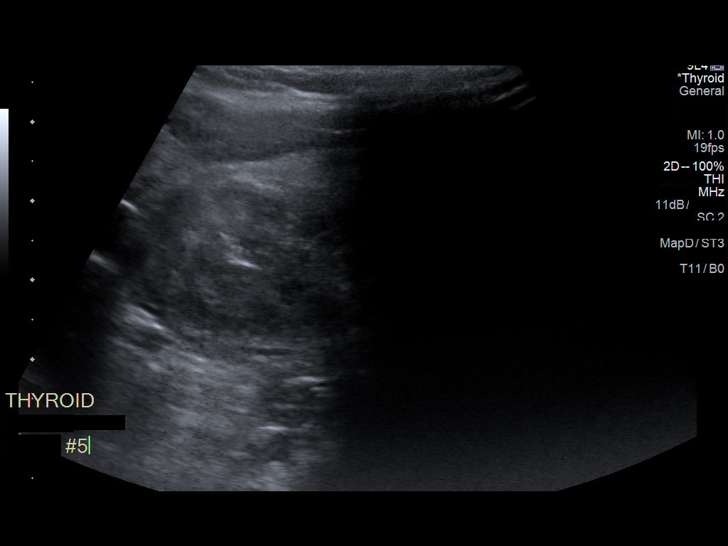
[im 14/15]
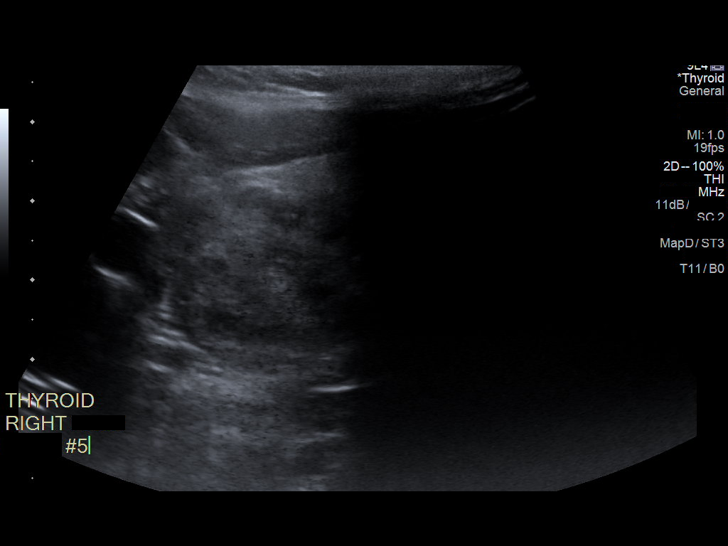
[im 15/15]
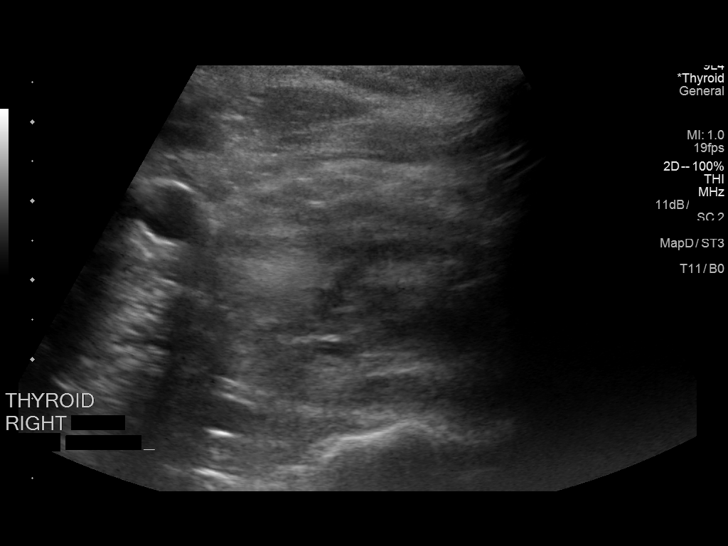

[13 of 15 positions shown; findings below may reference images not displayed]

Pre-procedural ultrasound scanning demonstrated unchanged size and
appearance of the indeterminate nodule within the right inferior
thyroid

The procedure was planned. The neck was prepped in the usual sterile
fashion, and a sterile drape was applied covering the operative
field. A timeout was performed prior to the initiation of the
procedure. Local anesthesia was provided with 1% lidocaine.

Under direct ultrasound guidance, 4 FNA biopsies were performed of
the 5.7 cm right inferior TR 3 nodule with a 25 gauge needle.
Multiple ultrasound images were saved for procedural documentation
purposes. The samples were prepared and submitted to pathology.

Limited post procedural scanning was negative for hematoma or
additional complication. Dressings were placed. The patient
tolerated the above procedures procedure well without immediate
postprocedural complication.
FINDINGS: Nodule reference number based on prior diagnostic ultrasound: 1

Maximum size:

Location: Right; Inferior

ACR TI-RADS risk category: TR3 (3 points)

Reason for biopsy: meets ACR TI-RADS criteria

Ultrasound imaging confirms appropriate placement of the needles
within the thyroid nodule.
IMPRESSION: Technically successful ultrasound guided fine needle aspiration of
the 5.7 cm right inferior TR 3 nodule

## 2018-07-30 ENCOUNTER — Other Ambulatory Visit: Payer: Self-pay | Admitting: *Deleted

## 2018-07-30 ENCOUNTER — Encounter: Payer: Self-pay | Admitting: Family Medicine

## 2018-07-30 DIAGNOSIS — E7800 Pure hypercholesterolemia, unspecified: Secondary | ICD-10-CM

## 2018-07-30 MED ORDER — ATORVASTATIN CALCIUM 20 MG PO TABS: 20.0000 mg | ORAL_TABLET | Freq: Every day | ORAL | 2 refills | Status: DC

## 2018-08-13 DIAGNOSIS — D235 Other benign neoplasm of skin of trunk: Secondary | ICD-10-CM | POA: Diagnosis not present

## 2018-08-20 ENCOUNTER — Other Ambulatory Visit: Payer: Self-pay | Admitting: Family Medicine

## 2018-08-20 NOTE — Telephone Encounter (Signed)
RF request for finasteride LOV: 07/16/18 Next ov: 09/17/18 Last written: 05/12/17 #30 w/ 12RF  Please advise. Thanks.

## 2018-09-17 ENCOUNTER — Ambulatory Visit (INDEPENDENT_AMBULATORY_CARE_PROVIDER_SITE_OTHER): Payer: 59 | Admitting: *Deleted

## 2018-09-17 DIAGNOSIS — Z23 Encounter for immunization: Secondary | ICD-10-CM | POA: Diagnosis not present

## 2018-09-17 NOTE — Progress Notes (Signed)
Pt in office today for second shingrix vaccine. Pt tolerated well w no side effects noted.

## 2018-11-12 ENCOUNTER — Other Ambulatory Visit: Payer: Self-pay | Admitting: Family Medicine

## 2018-11-15 NOTE — Telephone Encounter (Signed)
Pt has lab apt for tomorrow (11/16/18).

## 2018-11-16 ENCOUNTER — Encounter: Payer: Self-pay | Admitting: *Deleted

## 2018-11-16 ENCOUNTER — Other Ambulatory Visit (INDEPENDENT_AMBULATORY_CARE_PROVIDER_SITE_OTHER): Payer: 59

## 2018-11-16 DIAGNOSIS — E7800 Pure hypercholesterolemia, unspecified: Secondary | ICD-10-CM | POA: Diagnosis not present

## 2018-11-16 LAB — LIPID PANEL
Cholesterol: 128 mg/dL (ref 0–200)
HDL: 35.7 mg/dL — AB (ref >39.00)
LDL Cholesterol: 74 mg/dL (ref 0–99)
NONHDL: 91.92
Total CHOL/HDL Ratio: 4
Triglycerides: 89 mg/dL (ref 0.0–149.0)
VLDL: 17.8 mg/dL (ref 0.0–40.0)

## 2018-11-19 ENCOUNTER — Telehealth: Payer: Self-pay | Admitting: *Deleted

## 2018-11-19 MED ORDER — ATORVASTATIN CALCIUM 20 MG PO TABS: 20.0000 mg | ORAL_TABLET | Freq: Every day | ORAL | 3 refills | Status: DC

## 2018-11-19 NOTE — Telephone Encounter (Signed)
REassure him that I will check his liver labs when they are needed. These do not need to be checked every time he gets blood work.-thx

## 2018-11-19 NOTE — Telephone Encounter (Signed)
Opened in error

## 2018-11-19 NOTE — Telephone Encounter (Signed)
Mychart message from pt: Hi sorry to message again. My wife looked at my results and thought this medicine can also effect the liver, she was surprised this blood work didn't also check my liver enzymes. Maybe there is more results that will come later? We are both very pleased my triglycerides and cholesterol have decrease significantly. Thank you for checking on this.   I advised pt that we did not check his liver function with this blood draw.

## 2019-03-15 DIAGNOSIS — L821 Other seborrheic keratosis: Secondary | ICD-10-CM | POA: Diagnosis not present

## 2019-03-15 DIAGNOSIS — L814 Other melanin hyperpigmentation: Secondary | ICD-10-CM | POA: Diagnosis not present

## 2019-03-15 DIAGNOSIS — D2270 Melanocytic nevi of unspecified lower limb, including hip: Secondary | ICD-10-CM | POA: Diagnosis not present

## 2019-03-15 DIAGNOSIS — L818 Other specified disorders of pigmentation: Secondary | ICD-10-CM | POA: Diagnosis not present

## 2019-03-18 DIAGNOSIS — H35711 Central serous chorioretinopathy, right eye: Secondary | ICD-10-CM | POA: Diagnosis not present

## 2019-04-15 DIAGNOSIS — H35711 Central serous chorioretinopathy, right eye: Secondary | ICD-10-CM | POA: Diagnosis not present

## 2019-04-15 DIAGNOSIS — H43813 Vitreous degeneration, bilateral: Secondary | ICD-10-CM | POA: Diagnosis not present

## 2019-04-15 DIAGNOSIS — H43391 Other vitreous opacities, right eye: Secondary | ICD-10-CM | POA: Diagnosis not present

## 2019-04-15 DIAGNOSIS — H35423 Microcystoid degeneration of retina, bilateral: Secondary | ICD-10-CM | POA: Diagnosis not present

## 2019-07-11 DIAGNOSIS — M7122 Synovial cyst of popliteal space [Baker], left knee: Secondary | ICD-10-CM

## 2019-07-11 HISTORY — DX: Synovial cyst of popliteal space (Baker), left knee: M71.22

## 2019-07-18 ENCOUNTER — Ambulatory Visit: Payer: Self-pay

## 2019-07-18 ENCOUNTER — Ambulatory Visit: Payer: 59 | Admitting: Family Medicine

## 2019-07-18 ENCOUNTER — Other Ambulatory Visit: Payer: Self-pay

## 2019-07-18 ENCOUNTER — Encounter: Payer: Self-pay | Admitting: Family Medicine

## 2019-07-18 VITALS — BP 132/74 | HR 66 | Ht 72.0 in | Wt 230.0 lb

## 2019-07-18 DIAGNOSIS — M25562 Pain in left knee: Secondary | ICD-10-CM

## 2019-07-18 NOTE — Patient Instructions (Signed)
Nice to meet you Please use the brace  Please use heat on the back of the leg  Please try the exercises   Please send me a message in MyChart with any questions or updates.  Please see me back in 4 weeks.   --Dr. Raeford Razor

## 2019-07-18 NOTE — Assessment & Plan Note (Signed)
He has an MCL sprain and a large Baker's cyst/hematoma in the posterior aspect of the knee.  History of surgery in the same knee. -Counseled on home exercise therapy and supportive care. -Continue his hinged knee brace. -Counseled on heat. -Counseled on warning signs for blood clot. -If no improvement can consider injection of the knee or trying to aspirate the Baker's cyst.  Would consider physical therapy as well as x-ray.

## 2019-07-18 NOTE — Progress Notes (Signed)
Gary Collins - 58 y.o. male MRN [id number]  Date of birth: 01-24-1961  SUBJECTIVE:  Including CC & ROS.  Chief Complaint  Patient presents with  . Knee Injury    left knee x 07/13/2019    Gary Collins is a 58 y.o. male that is presenting with acute left knee pain.  On 10/3 he fell and landed on the left knee.  In his college days he had a arthroscopic surgery on the same knee.  He had significant swelling when it first occurred.  He has been using a hinged knee brace and has had improvement of his swelling.  He still cannot completely flex or extend the knee.  The pain has improved.  The pain is over the medial joint line as well as posteriorly.  The pain is mild in nature.  Denies any mechanical symptoms.  Denies any numbness or tingling.   Review of Systems  Constitutional: Negative for fever.  HENT: Negative for congestion.   Respiratory: Negative for cough.   Cardiovascular: Negative for chest pain.  Gastrointestinal: Negative for abdominal pain.  Musculoskeletal: Positive for arthralgias, gait problem and joint swelling.  Skin: Negative for color change.  Neurological: Negative for weakness.  Hematological: Negative for adenopathy.    HISTORY: Past Medical, Surgical, Social, and Family History Reviewed & Updated per EMR.   Pertinent Historical Findings include:  Past Medical History:  Diagnosis Date  . Adenomatous colon polyp 11/18/13; 07/10/17   Recall 3-5 yrs  . Allergy   . Gross hematuria 06/2013   Right kidney radiolucent stone believed to be uric acid stone, cysto normal:  potassium citrate ER 20 mEQ (urocit K) bid by urology  . History of kidney stones   . Hyperlipidemia, mixed 2017   TLC recommended 2017 and 2018.  Statin recommended 07/2018  . Inguinal hernia recurrent unilateral 05/30/13   Small, right sided, containing adipose tissue (noted on CT scan for hematuria)  . 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 Pilonidal cyst with abscess 07/15/2011   Qualifier: Diagnosis of  By: Alveta Heimlich MD, Cornelia Copa    . 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  Followed by Dr. Loanne Drilling.   No changes on u/s f/u 09/2012.  Repeat u/s planned by Dr. Loanne Drilling as of 02/2017 f/u.    Past Surgical History:  Procedure Laterality Date  . BIOPSY THYROID    . COLONOSCOPY W/ POLYPECTOMY  07/10/2017   Adenomatous: recall 3-5 yrs  . EXTRACORPOREAL SHOCK WAVE LITHOTRIPSY Right 10/17/2016   Procedure: RIGHT EXTRACORPOREAL SHOCK WAVE LITHOTRIPSY (ESWL);  Surgeon: Carolan Clines, MD;  Location: [facility];  Service: Urology;  Laterality: Right;  . KNEE ARTHROSCOPY     left    Allergies  Allergen Reactions  . Beta Adrenergic Blockers Other (See Comments)    Not good for me per dr.  . Ibuprofen     REACTION: lip swelling  . Nsaids Other (See Comments)    Lip swelling    Family History  Problem Relation Age of Onset  . Cancer Father        bladder  . Colon cancer Neg Hx   . Stomach cancer Neg Hx      Social History   Socioeconomic History  . Marital status: Married    Spouse name: Not on file  . Number of children: Not on file  . Years of education:  Not on file  . Highest education level: Not on file  Occupational History  . Not on file  Social Needs  . Financial resource strain: Not on file  . Food insecurity    Worry: Not on file    Inability: Not on file  . Transportation needs    Medical: Not on file    Non-medical: Not on file  Tobacco Use  . Smoking status: Never Smoker  . Smokeless tobacco: Never Used  Substance and Sexual Activity  . Alcohol use: Yes    Alcohol/week: 12.0 - 18.0 standard drinks    Types: 12 - 18 Cans of beer per week    Comment: 12-18 beers per week  . Drug use: No  . Sexual activity: Not on file  Lifestyle  . Physical activity    Days per week: Not on file    Minutes per session: Not on file  . Stress: Not on file   Relationships  . Social Herbalist on phone: Not on file    Gets together: Not on file    Attends religious service: Not on file    Active member of club or organization: Not on file    Attends meetings of clubs or organizations: Not on file    Relationship status: Not on file  . Intimate partner violence    Fear of current or ex partner: Not on file    Emotionally abused: Not on file    Physically abused: Not on file    Forced sexual activity: Not on file  Other Topics Concern  . Not on file  Social History Narrative   Married, 57 y/o girl and 49 boy.   Occupation: Clinical biochemist (residential).   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   Enjoys fishing.  Exercises 2-3 times per week: 30 min elliptical.  Also plays soccer.   No tob, occ beer, no drugs.           PHYSICAL EXAM:  VS: BP 132/74   Pulse 66   Ht 6' (1.829 m)   Wt 230 lb (104.3 kg)   BMI 31.19 kg/m  Physical Exam Gen: NAD, alert, cooperative with exam, well-appearing ENT: normal lips, normal nasal mucosa,  Eye: normal EOM, normal conjunctiva and lids CV:  no edema, +2 pedal pulses   Resp: no accessory muscle use, non-labored,  Skin: no rashes, no areas of induration  Neuro: normal tone, normal sensation to touch Psych:  normal insight, alert and oriented MSK:  Left knee: Mild effusion. Tenderness to palpation of the medial joint line and medial femoral condyle. Pain with valgus stress testing. Limited extension to about 5 degrees. Limited flexion to around 90 degrees. Negative McMurray's test. Negative anterior drawer. Neurovascular intact  Limited ultrasound: Left knee:  Moderate effusion within the suprapatellar pouch. Quadricep and patellar tendon are normal and intact. Moderate joint space narrowing with degenerative changes of the meniscus demonstrating outpouching. The origin of the MCL is heterogenous with swelling and increased vascularity to suggest a sprain. Large Baker's cyst in the  posterior compartment with changes to suggest coagulation of blood.  Summary: Findings suggest MCL sprain plus large Baker's cyst/hematoma  Ultrasound and interpretation by Clearance Coots, MD      ASSESSMENT & PLAN:   Acute pain of left knee He has an MCL sprain and a large Baker's cyst/hematoma in the posterior aspect of the knee.  History of surgery in the same knee. -Counseled on home exercise therapy and supportive care. -  Continue his hinged knee brace. -Counseled on heat. -Counseled on warning signs for blood clot. -If no improvement can consider injection of the knee or trying to aspirate the Baker's cyst.  Would consider physical therapy as well as x-ray.

## 2019-07-19 ENCOUNTER — Encounter: Payer: Self-pay | Admitting: Family Medicine

## 2019-07-19 ENCOUNTER — Ambulatory Visit (INDEPENDENT_AMBULATORY_CARE_PROVIDER_SITE_OTHER): Payer: 59 | Admitting: Family Medicine

## 2019-07-19 VITALS — BP 119/79 | HR 61 | Temp 98.2°F | Resp 16 | Ht 72.0 in | Wt 233.4 lb

## 2019-07-19 DIAGNOSIS — E7800 Pure hypercholesterolemia, unspecified: Secondary | ICD-10-CM

## 2019-07-19 DIAGNOSIS — Z1211 Encounter for screening for malignant neoplasm of colon: Secondary | ICD-10-CM | POA: Diagnosis not present

## 2019-07-19 DIAGNOSIS — E049 Nontoxic goiter, unspecified: Secondary | ICD-10-CM

## 2019-07-19 DIAGNOSIS — Z125 Encounter for screening for malignant neoplasm of prostate: Secondary | ICD-10-CM | POA: Diagnosis not present

## 2019-07-19 DIAGNOSIS — Z0000 Encounter for general adult medical examination without abnormal findings: Secondary | ICD-10-CM | POA: Diagnosis not present

## 2019-07-19 DIAGNOSIS — Z23 Encounter for immunization: Secondary | ICD-10-CM

## 2019-07-19 LAB — COMPREHENSIVE METABOLIC PANEL
ALT: 16 U/L (ref 0–53)
AST: 26 U/L (ref 0–37)
Albumin: 4.6 g/dL (ref 3.5–5.2)
Alkaline Phosphatase: 63 U/L (ref 39–117)
BUN: 17 mg/dL (ref 6–23)
CO2: 28 mEq/L (ref 19–32)
Calcium: 9.5 mg/dL (ref 8.4–10.5)
Chloride: 104 mEq/L (ref 96–112)
Creatinine, Ser: 0.96 mg/dL (ref 0.40–1.50)
GFR: 80.73 mL/min (ref >60.00)
Glucose, Bld: 101 mg/dL — ABNORMAL HIGH (ref 70–99)
Potassium: 4.5 mEq/L (ref 3.5–5.1)
Sodium: 139 mEq/L (ref 135–145)
Total Bilirubin: 1.2 mg/dL (ref 0.2–1.2)
Total Protein: 7.5 g/dL (ref 6.0–8.3)

## 2019-07-19 LAB — CBC WITH DIFFERENTIAL/PLATELET
Basophils Absolute: 0.1 10*3/uL (ref 0.0–0.1)
Basophils Relative: 1 % (ref 0.0–3.0)
Eosinophils Absolute: 0.1 10*3/uL (ref 0.0–0.7)
Eosinophils Relative: 1.8 % (ref 0.0–5.0)
HCT: 44.2 % (ref 39.0–52.0)
Hemoglobin: 14.6 g/dL (ref 13.0–17.0)
Lymphocytes Relative: 27.5 % (ref 12.0–46.0)
Lymphs Abs: 1.8 10*3/uL (ref 0.7–4.0)
MCHC: 33.2 g/dL (ref 30.0–36.0)
MCV: 89.8 fl (ref 78.0–100.0)
Monocytes Absolute: 0.5 10*3/uL (ref 0.1–1.0)
Monocytes Relative: 7.7 % (ref 3.0–12.0)
Neutro Abs: 4.1 10*3/uL (ref 1.4–7.7)
Neutrophils Relative %: 62 % (ref 43.0–77.0)
Platelets: 213 10*3/uL (ref 150.0–400.0)
RBC: 4.92 Mil/uL (ref 4.22–5.81)
RDW: 12.8 % (ref 11.5–15.5)
WBC: 6.7 10*3/uL (ref 4.0–10.5)

## 2019-07-19 LAB — LIPID PANEL
Cholesterol: 113 mg/dL (ref 0–200)
HDL: 37.4 mg/dL — ABNORMAL LOW (ref >39.00)
LDL Cholesterol: 59 mg/dL (ref 0–99)
NonHDL: 75.52
Total CHOL/HDL Ratio: 3
Triglycerides: 85 mg/dL (ref 0.0–149.0)
VLDL: 17 mg/dL (ref 0.0–40.0)

## 2019-07-19 LAB — TSH: TSH: 1.36 u[IU]/mL (ref 0.35–4.50)

## 2019-07-19 LAB — PSA: PSA: 0.51 ng/mL (ref 0.10–4.00)

## 2019-07-19 NOTE — Patient Instructions (Signed)

## 2019-07-19 NOTE — Progress Notes (Signed)
Office Note 07/19/2019  CC:  Chief Complaint  Patient presents with  . Annual Exam    pt is fasting    HPI:  Gary Collins is a 58 y.o. White male who is here for annual health maintenance exam. Sounds like he sprained his L MCL and has L pop cyst --he slipped and buckled L knee last week. Saw Dr. Raeford Razor yesterday  Otherwise doing well.  Keeping busy with work and exercise. Diet is fairly healthy.  Has purposefully lost wt over the last year: about 15 lbs.   Past Medical History:  Diagnosis Date  . Adenomatous colon polyp 11/18/13; 07/10/17   Recall 3-5 yrs  . Allergy   . Gross hematuria 06/2013   Right kidney radiolucent stone believed to be uric acid stone, cysto normal:  potassium citrate ER 20 mEQ (urocit K) bid by urology  . History of kidney stones   . Hyperlipidemia, mixed 2017   TLC recommended 2017 and 2018.  Statin recommended 07/2018  . Inguinal hernia recurrent unilateral 05/30/13   Small, right sided, containing adipose tissue (noted on CT scan for hematuria)  . 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 Pilonidal cyst with abscess 07/15/2011   Qualifier: Diagnosis of  By: Alveta Heimlich MD, Cornelia Copa    . 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  Followed by Dr. Loanne Drilling.   No changes on u/s f/u 09/2012.  Repeat u/s planned by Dr. Loanne Drilling as of 02/2017 f/u.    Past Surgical History:  Procedure Laterality Date  . BIOPSY THYROID    . COLONOSCOPY W/ POLYPECTOMY  07/10/2017   Adenomatous: recall 3-5 yrs  . EXTRACORPOREAL SHOCK WAVE LITHOTRIPSY Right 10/17/2016   Procedure: RIGHT EXTRACORPOREAL SHOCK WAVE LITHOTRIPSY (ESWL);  Surgeon: Carolan Clines, MD;  Location: [facility];  Service: Urology;  Laterality: Right;  . KNEE ARTHROSCOPY     left    Family History  Problem Relation Age of Onset  . Cancer Father        bladder  . Colon cancer Neg Hx   . Stomach  cancer Neg Hx     Social History   Socioeconomic History  . Marital status: Married    Spouse name: Not on file  . Number of children: Not on file  . Years of education: Not on file  . Highest education level: Not on file  Occupational History  . Not on file  Social Needs  . Financial resource strain: Not on file  . Food insecurity    Worry: Not on file    Inability: Not on file  . Transportation needs    Medical: Not on file    Non-medical: Not on file  Tobacco Use  . Smoking status: Never Smoker  . Smokeless tobacco: Never Used  Substance and Sexual Activity  . Alcohol use: Yes    Alcohol/week: 12.0 - 18.0 standard drinks    Types: 12 - 18 Cans of beer per week    Comment: 12-18 beers per week  . Drug use: No  . Sexual activity: Not on file  Lifestyle  . Physical activity    Days per week: Not on file    Minutes per session: Not on file  . Stress: Not on file  Relationships  . Social Herbalist on phone: Not on file    Gets together: Not on file  Attends religious service: Not on file    Active member of club or organization: Not on file    Attends meetings of clubs or organizations: Not on file    Relationship status: Not on file  . Intimate partner violence    Fear of current or ex partner: Not on file    Emotionally abused: Not on file    Physically abused: Not on file    Forced sexual activity: Not on file  Other Topics Concern  . Not on file  Social History Narrative   Married, 57 y/o girl and 74 boy.   Occupation: Clinical biochemist (residential).   Orig from [place], [place].   Enjoys fishing.  Exercises 2-3 times per week: 30 min elliptical.  Also plays soccer.   No tob, occ beer, no drugs.          Outpatient Medications Prior to Visit  Medication Sig Dispense Refill  . atorvastatin (LIPITOR) 20 MG tablet Take 1 tablet (20 mg total) by mouth daily. 90 tablet 3  . cetirizine (ZYRTEC) 10 MG tablet Take 10 mg by mouth daily.    .  finasteride (PROPECIA) 1 MG tablet TAKE 1 TABLET BY MOUTH ONCE DAILY 90 tablet 3  . Omega-3 Fatty Acids (FISH OIL PO) Take 4 tablets by mouth daily.     No facility-administered medications prior to visit.     Allergies  Allergen Reactions  . Beta Adrenergic Blockers Other (See Comments)    Not good for me per dr.  . Ibuprofen     REACTION: lip swelling  . Nsaids Other (See Comments)    Lip swelling    ROS Review of Systems  Constitutional: Negative for appetite change, chills, fatigue and fever.  HENT: Negative for congestion, dental problem, ear pain and sore throat.   Eyes: Negative for discharge, redness and visual disturbance.  Respiratory: Negative for cough, chest tightness, shortness of breath and wheezing.   Cardiovascular: Negative for chest pain, palpitations and leg swelling.  Gastrointestinal: Negative for abdominal pain, blood in stool, diarrhea, nausea and vomiting.  Genitourinary: Negative for difficulty urinating, dysuria, flank pain, frequency, hematuria and urgency.  Musculoskeletal: Positive for arthralgias (L knee pain and swelling as per HPI). Negative for back pain, joint swelling, myalgias and neck stiffness.  Skin: Negative for pallor and rash.  Neurological: Negative for dizziness, speech difficulty, weakness and headaches.  Hematological: Negative for adenopathy. Does not bruise/bleed easily.  Psychiatric/Behavioral: Negative for confusion and sleep disturbance. The patient is not nervous/anxious.     PE; Blood pressure 119/79, pulse 61, temperature 98.2 F (36.8 C), temperature source Temporal, resp. rate 16, height 6' (1.829 m), weight 233 lb 6.4 oz (105.9 kg), SpO2 98 %. Body mass index is 31.65 kg/m.  Gen: Alert, well appearing.  Patient is oriented to person, place, time, and situation. AFFECT: pleasant, lucid thought and speech. ENT: Ears: EACs clear, normal epithelium.  TMs with good light reflex and landmarks bilaterally.  Eyes: no injection,  icteris, swelling, or exudate.  EOMI, PERRLA. Nose: no drainage or turbinate edema/swelling.  No injection or focal lesion.  Mouth: lips without lesion/swelling.  Oral mucosa pink and moist.  Dentition intact and without obvious caries or gingival swelling.  Oropharynx without erythema, exudate, or swelling.  Neck: supple/nontender.  No LAD, mass, or TM.  Carotid pulses 2+ bilaterally, without bruits. CV: RRR, no m/r/g.   LUNGS: CTA bilat, nonlabored resps, good aeration in all lung fields. ABD: soft, NT, ND, BS normal.  No  hepatospenomegaly or mass.  No bruits. EXT: no clubbing, cyanosis, or edema.  Left knee with moderate swelling, no erythema. Musculoskeletal: no joint swelling, erythema, warmth, or tenderness.  ROM of all joints intact. Skin - no sores or suspicious lesions or rashes or color changes Rectal exam: negative without mass, lesions or tenderness, PROSTATE EXAM: smooth and symmetric without nodules or tenderness.   Pertinent labs:  Lab Results  Component Value Date   TSH 1.98 07/16/2018   Lab Results  Component Value Date   WBC 7.8 07/16/2018   HGB 14.6 07/16/2018   HCT 41.7 07/16/2018   MCV 85.8 07/16/2018   PLT 217 07/16/2018   Lab Results  Component Value Date   CREATININE 0.90 07/16/2018   BUN 13 07/16/2018   NA 137 07/16/2018   K 4.1 07/16/2018   CL 101 07/16/2018   CO2 23 07/16/2018   Lab Results  Component Value Date   ALT 12 07/16/2018   AST 21 07/16/2018   ALKPHOS 61 06/05/2017   BILITOT 0.9 07/16/2018   Lab Results  Component Value Date   CHOL 128 11/16/2018   Lab Results  Component Value Date   HDL 35.70 (L) 11/16/2018   Lab Results  Component Value Date   LDLCALC 74 11/16/2018   Lab Results  Component Value Date   TRIG 89.0 11/16/2018   Lab Results  Component Value Date   CHOLHDL 4 11/16/2018   Lab Results  Component Value Date   PSA 0.8 07/16/2018   PSA 0.56 06/05/2017   PSA 1.07 02/12/2016    ASSESSMENT AND PLAN:    Health maintenance exam: Reviewed age and gender appropriate health maintenance issues (prudent diet, regular exercise, health risks of tobacco and excessive alcohol, use of seatbelts, fire alarms in home, use of sunscreen).  Also reviewed age and gender appropriate health screening as well as vaccine recommendations. Vaccines: flu vaccine->given today.  Pt otherwise UTD on all recommended vaccines, including shingrix. Labs: fasting HP + PSA. Prostate ca screening: DRE normal today , PSA. Colon ca screening: next colonoscopy due 2021-2023 time frame.  Recent L knee medial collateral ligament sprain->mgmt as per sports med->hinged knee brace.  An After Visit Summary was printed and given to the patient.  FOLLOW UP:  No follow-ups on file.  Signed:  Crissie Sickles, MD           07/19/2019

## 2019-07-19 NOTE — Addendum Note (Signed)
Addended by: Deveron Furlong D on: 07/19/2019 08:51 AM   Modules accepted: Orders

## 2019-07-22 ENCOUNTER — Encounter: Payer: Self-pay | Admitting: Family Medicine

## 2019-07-28 ENCOUNTER — Encounter: Payer: Self-pay | Admitting: Family Medicine

## 2019-08-13 ENCOUNTER — Other Ambulatory Visit: Payer: Self-pay

## 2019-08-13 DIAGNOSIS — Z20822 Contact with and (suspected) exposure to covid-19: Secondary | ICD-10-CM

## 2019-08-14 LAB — NOVEL CORONAVIRUS, NAA: SARS-CoV-2, NAA: NOT DETECTED

## 2019-08-22 DIAGNOSIS — H35711 Central serous chorioretinopathy, right eye: Secondary | ICD-10-CM | POA: Diagnosis not present

## 2019-08-22 DIAGNOSIS — H43391 Other vitreous opacities, right eye: Secondary | ICD-10-CM | POA: Diagnosis not present

## 2019-08-22 DIAGNOSIS — H35423 Microcystoid degeneration of retina, bilateral: Secondary | ICD-10-CM | POA: Diagnosis not present

## 2019-08-22 DIAGNOSIS — H43813 Vitreous degeneration, bilateral: Secondary | ICD-10-CM | POA: Diagnosis not present

## 2019-09-23 ENCOUNTER — Other Ambulatory Visit: Payer: Self-pay | Admitting: Family Medicine

## 2020-01-09 DIAGNOSIS — H43813 Vitreous degeneration, bilateral: Secondary | ICD-10-CM | POA: Diagnosis not present

## 2020-01-09 DIAGNOSIS — H35711 Central serous chorioretinopathy, right eye: Secondary | ICD-10-CM | POA: Diagnosis not present

## 2020-01-09 DIAGNOSIS — H35423 Microcystoid degeneration of retina, bilateral: Secondary | ICD-10-CM | POA: Diagnosis not present

## 2020-02-05 ENCOUNTER — Other Ambulatory Visit: Payer: Self-pay | Admitting: Family Medicine

## 2020-07-09 ENCOUNTER — Other Ambulatory Visit: Payer: Self-pay | Admitting: Family Medicine

## 2020-07-16 DIAGNOSIS — H43813 Vitreous degeneration, bilateral: Secondary | ICD-10-CM | POA: Diagnosis not present

## 2020-07-16 DIAGNOSIS — H35423 Microcystoid degeneration of retina, bilateral: Secondary | ICD-10-CM | POA: Diagnosis not present

## 2020-07-16 DIAGNOSIS — H35711 Central serous chorioretinopathy, right eye: Secondary | ICD-10-CM | POA: Diagnosis not present

## 2020-07-20 ENCOUNTER — Other Ambulatory Visit: Payer: Self-pay

## 2020-07-20 ENCOUNTER — Ambulatory Visit (INDEPENDENT_AMBULATORY_CARE_PROVIDER_SITE_OTHER): Payer: 59 | Admitting: Family Medicine

## 2020-07-20 ENCOUNTER — Encounter: Payer: Self-pay | Admitting: Family Medicine

## 2020-07-20 VITALS — BP 136/81 | HR 53 | Temp 97.8°F | Resp 16 | Ht 72.0 in | Wt 238.4 lb

## 2020-07-20 DIAGNOSIS — E7800 Pure hypercholesterolemia, unspecified: Secondary | ICD-10-CM | POA: Diagnosis not present

## 2020-07-20 DIAGNOSIS — R030 Elevated blood-pressure reading, without diagnosis of hypertension: Secondary | ICD-10-CM

## 2020-07-20 DIAGNOSIS — Z125 Encounter for screening for malignant neoplasm of prostate: Secondary | ICD-10-CM

## 2020-07-20 DIAGNOSIS — Z23 Encounter for immunization: Secondary | ICD-10-CM | POA: Diagnosis not present

## 2020-07-20 DIAGNOSIS — Z0000 Encounter for general adult medical examination without abnormal findings: Secondary | ICD-10-CM

## 2020-07-20 LAB — COMPREHENSIVE METABOLIC PANEL
ALT: 13 U/L (ref 0–53)
AST: 24 U/L (ref 0–37)
Albumin: 4.5 g/dL (ref 3.5–5.2)
Alkaline Phosphatase: 60 U/L (ref 39–117)
BUN: 14 mg/dL (ref 6–23)
CO2: 29 mEq/L (ref 19–32)
Calcium: 9 mg/dL (ref 8.4–10.5)
Chloride: 103 mEq/L (ref 96–112)
Creatinine, Ser: 1.07 mg/dL (ref 0.40–1.50)
GFR: 76.1 mL/min (ref >60.00)
Glucose, Bld: 100 mg/dL — ABNORMAL HIGH (ref 70–99)
Potassium: 4.3 mEq/L (ref 3.5–5.1)
Sodium: 138 mEq/L (ref 135–145)
Total Bilirubin: 0.9 mg/dL (ref 0.2–1.2)
Total Protein: 7.2 g/dL (ref 6.0–8.3)

## 2020-07-20 LAB — CBC WITH DIFFERENTIAL/PLATELET
Basophils Absolute: 0.1 10*3/uL (ref 0.0–0.1)
Basophils Relative: 1.3 % (ref 0.0–3.0)
Eosinophils Absolute: 0.1 10*3/uL (ref 0.0–0.7)
Eosinophils Relative: 2.1 % (ref 0.0–5.0)
HCT: 43.9 % (ref 39.0–52.0)
Hemoglobin: 14.5 g/dL (ref 13.0–17.0)
Lymphocytes Relative: 28.7 % (ref 12.0–46.0)
Lymphs Abs: 1.8 10*3/uL (ref 0.7–4.0)
MCHC: 33 g/dL (ref 30.0–36.0)
MCV: 91.6 fl (ref 78.0–100.0)
Monocytes Absolute: 0.6 10*3/uL (ref 0.1–1.0)
Monocytes Relative: 9.2 % (ref 3.0–12.0)
Neutro Abs: 3.6 10*3/uL (ref 1.4–7.7)
Neutrophils Relative %: 58.7 % (ref 43.0–77.0)
Platelets: 181 10*3/uL (ref 150.0–400.0)
RBC: 4.79 Mil/uL (ref 4.22–5.81)
RDW: 13.3 % (ref 11.5–15.5)
WBC: 6.2 10*3/uL (ref 4.0–10.5)

## 2020-07-20 LAB — LIPID PANEL
Cholesterol: 148 mg/dL (ref 0–200)
HDL: 39.6 mg/dL (ref >39.00)
LDL Cholesterol: 74 mg/dL (ref 0–99)
NonHDL: 108.76
Total CHOL/HDL Ratio: 4
Triglycerides: 172 mg/dL — ABNORMAL HIGH (ref 0.0–149.0)
VLDL: 34.4 mg/dL (ref 0.0–40.0)

## 2020-07-20 LAB — PSA: PSA: 0.55 ng/mL (ref 0.10–4.00)

## 2020-07-20 LAB — TSH: TSH: 1.56 u[IU]/mL (ref 0.35–4.50)

## 2020-07-20 MED ORDER — LISINOPRIL 30 MG PO TABS: 30.0000 mg | ORAL_TABLET | Freq: Every day | ORAL | 0 refills | Status: DC

## 2020-07-20 NOTE — Progress Notes (Signed)
Office Note 07/20/2020  CC:  Chief Complaint  Patient presents with  . Annual Exam    pt is fasting    HPI:  Gary Collins is a 59 y.o. White male who is here for annual health maintenance exam.  No home bp monitoring. Trying to eat right and exercise well.  Takes statin qod since last.  Past Medical History:  Diagnosis Date  . Adenomatous colon polyp 11/18/13; 07/10/17   Recall 3-5 yrs  . Allergy   . Baker's cyst of knee, left 07/2019  . Gross hematuria 06/2013   Right kidney radiolucent stone believed to be uric acid stone, cysto normal:  potassium citrate ER 20 mEQ (urocit K) bid by urology  . History of kidney stones   . Hyperlipidemia, mixed 2017   TLC recommended 2017 and 2018.  Good response to statin 2019. Stable 2020->ok for pt to take statin qod.  . Inguinal hernia recurrent unilateral 05/30/13   Small, right sided, containing adipose tissue (noted on CT scan for hematuria)  . 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 Pilonidal cyst with abscess 07/15/2011   Qualifier: Diagnosis of  By: Alveta Heimlich MD, Cornelia Copa    . 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  Followed by Dr. Loanne Drilling.   No changes on u/s f/u 09/2012.  Repeat u/s planned by Dr. Loanne Drilling as of 02/2017 f/u.    Past Surgical History:  Procedure Laterality Date  . BIOPSY THYROID    . COLONOSCOPY W/ POLYPECTOMY  07/10/2017   Adenomatous: recall 3-5 yrs  . EXTRACORPOREAL SHOCK WAVE LITHOTRIPSY Right 10/17/2016   Procedure: RIGHT EXTRACORPOREAL SHOCK WAVE LITHOTRIPSY (ESWL);  Surgeon: Carolan Clines, MD;  Location: [facility];  Service: Urology;  Laterality: Right;  . KNEE ARTHROSCOPY     left    Family History  Problem Relation Age of Onset  . Cancer Father        bladder  . Colon cancer Neg Hx   . Stomach cancer Neg Hx     Social History   Socioeconomic History  . Marital status: Married     Spouse name: Not on file  . Number of children: Not on file  . Years of education: Not on file  . Highest education level: Not on file  Occupational History  . Not on file  Tobacco Use  . Smoking status: Never Smoker  . Smokeless tobacco: Never Used  Vaping Use  . Vaping Use: Never used  Substance and Sexual Activity  . Alcohol use: Yes    Alcohol/week: 12.0 - 18.0 standard drinks    Types: 12 - 18 Cans of beer per week    Comment: 12-18 beers per week  . Drug use: No  . Sexual activity: Not on file  Other Topics Concern  . Not on file  Social History Narrative   Married, 58 y/o girl and 37 boy.   Occupation: Clinical biochemist (residential).   Orig from [place], [place].   Enjoys fishing.  Exercises 2-3 times per week: 30 min elliptical.  Also plays soccer.   No tob, occ beer, no drugs.         Social Determinants of Health   Financial Resource Strain:   . Difficulty of Paying Living Expenses: Not on file  Food Insecurity:   . Worried About Charity fundraiser in the Last Year: Not on file  .  Ran Out of Food in the Last Year: Not on file  Transportation Needs:   . Lack of Transportation (Medical): Not on file  . Lack of Transportation (Non-Medical): Not on file  Physical Activity:   . Days of Exercise per Week: Not on file  . Minutes of Exercise per Session: Not on file  Stress:   . Feeling of Stress : Not on file  Social Connections:   . Frequency of Communication with Friends and Family: Not on file  . Frequency of Social Gatherings with Friends and Family: Not on file  . Attends Religious Services: Not on file  . Active Member of Clubs or Organizations: Not on file  . Attends Archivist Meetings: Not on file  . Marital Status: Not on file  Intimate Partner Violence:   . Fear of Current or Ex-Partner: Not on file  . Emotionally Abused: Not on file  . Physically Abused: Not on file  . Sexually Abused: Not on file    Outpatient Medications Prior to  Visit  Medication Sig Dispense Refill  . atorvastatin (LIPITOR) 20 MG tablet TAKE 1 TABLET BY MOUTH ONCE A DAY 90 tablet 1  . finasteride (PROPECIA) 1 MG tablet TAKE 1 TABLET BY MOUTH ONCE DAILY 90 tablet 0  . Omega-3 Fatty Acids (FISH OIL PO) Take 4 tablets by mouth daily.    . cetirizine (ZYRTEC) 10 MG tablet Take 10 mg by mouth daily. (Patient not taking: Reported on 07/20/2020)     No facility-administered medications prior to visit.    Allergies  Allergen Reactions  . Beta Adrenergic Blockers Other (See Comments)    Not good for me per dr.  . Ibuprofen     REACTION: lip swelling  . Nsaids Other (See Comments)    Lip swelling    Review of Systems  Constitutional: Negative for appetite change, chills, fatigue and fever.  HENT: Negative for congestion, dental problem, ear pain and sore throat.   Eyes: Negative for discharge, redness and visual disturbance.  Respiratory: Negative for cough, chest tightness, shortness of breath and wheezing.   Cardiovascular: Negative for chest pain, palpitations and leg swelling.  Gastrointestinal: Negative for abdominal pain, blood in stool, diarrhea, nausea and vomiting.  Genitourinary: Negative for difficulty urinating, dysuria, flank pain, frequency, hematuria and urgency.  Musculoskeletal: Negative for arthralgias, back pain, joint swelling, myalgias and neck stiffness.  Skin: Negative for pallor and rash.  Neurological: Negative for dizziness, speech difficulty, weakness and headaches.  Hematological: Negative for adenopathy. Does not bruise/bleed easily.  Psychiatric/Behavioral: Negative for confusion and sleep disturbance. The patient is not nervous/anxious.     PE; Vitals with BMI 07/20/2020 07/19/2019 07/18/2019  Height 6\' 0"  6\' 0"  6\' 0"   Weight 238 lbs 6 oz 233 lbs 6 oz 230 lbs  BMI 32.33 54.09 81.19  Systolic 147 829 562  Diastolic 81 79 74  Pulse 53 61 66  Manual bp repeat at end of visit today: 116/72   Gen: Alert, well  appearing.  Patient is oriented to person, place, time, and situation. AFFECT: pleasant, lucid thought and speech. ENT: Ears: EACs clear, normal epithelium.  TMs with good light reflex and landmarks bilaterally.  Eyes: no injection, icteris, swelling, or exudate.  EOMI, PERRLA. Nose: no drainage or turbinate edema/swelling.  No injection or focal lesion.  Mouth: lips without lesion/swelling.  Oral mucosa pink and moist.  Dentition intact and without obvious caries or gingival swelling.  Oropharynx without erythema, exudate, or swelling.  Neck:  supple/nontender.  No LAD, mass, or TM.  Carotid pulses 2+ bilaterally, without bruits. CV: RRR, no m/r/g.   LUNGS: CTA bilat, nonlabored resps, good aeration in all lung fields. ABD: soft, NT, ND, BS normal.  No hepatospenomegaly or mass.  No bruits. EXT: no clubbing, cyanosis, or edema.  Musculoskeletal: no joint swelling, erythema, warmth, or tenderness.  ROM of all joints intact. Skin - no sores or suspicious lesions or rashes or color changes Rectal exam: negative without mass, lesions or tenderness, PROSTATE EXAM: smooth and symmetric without nodules or tenderness.   Pertinent labs:  Lab Results  Component Value Date   TSH 1.36 07/19/2019   Lab Results  Component Value Date   WBC 6.7 07/19/2019   HGB 14.6 07/19/2019   HCT 44.2 07/19/2019   MCV 89.8 07/19/2019   PLT 213.0 07/19/2019   Lab Results  Component Value Date   CREATININE 0.96 07/19/2019   BUN 17 07/19/2019   NA 139 07/19/2019   K 4.5 07/19/2019   CL 104 07/19/2019   CO2 28 07/19/2019   Lab Results  Component Value Date   ALT 16 07/19/2019   AST 26 07/19/2019   ALKPHOS 63 07/19/2019   BILITOT 1.2 07/19/2019   Lab Results  Component Value Date   CHOL 113 07/19/2019   Lab Results  Component Value Date   HDL 37.40 (L) 07/19/2019   Lab Results  Component Value Date   LDLCALC 59 07/19/2019   Lab Results  Component Value Date   TRIG 85.0 07/19/2019   Lab  Results  Component Value Date   CHOLHDL 3 07/19/2019   Lab Results  Component Value Date   PSA 0.51 07/19/2019   PSA 0.8 07/16/2018   PSA 0.56 06/05/2017    ASSESSMENT AND PLAN:   1) Health maintenance exam: Reviewed age and gender appropriate health maintenance issues (prudent diet, regular exercise, health risks of tobacco and excessive alcohol, use of seatbelts, fire alarms in home, use of sunscreen).  Also reviewed age and gender appropriate health screening as well as vaccine recommendations. Vaccines: tdap UTD.  Covid 19->UTD.  Flu ->given today. Labs: fasting HP + PSA. Prostate ca screening: DRE normal today, PSA. Colon ca screening: hx polyps, recall 2021-2023 time frame.  2) elev bp w/out dx htn: monitor at home daily for a week to see if persistently up and in need of med.  3) HLD: FLP and hepatic panel today. If chol is up compared to last check then we'll change his statin from qod to qd.  An After Visit Summary was printed and given to the patient.  FOLLOW UP:  Return in about 1 year (around 07/20/2021) for annual CPE (fasting).  Signed:  Crissie Sickles, MD           07/20/2020

## 2020-07-20 NOTE — Patient Instructions (Signed)

## 2020-08-14 DIAGNOSIS — D229 Melanocytic nevi, unspecified: Secondary | ICD-10-CM | POA: Diagnosis not present

## 2020-08-14 DIAGNOSIS — L814 Other melanin hyperpigmentation: Secondary | ICD-10-CM | POA: Diagnosis not present

## 2020-08-14 DIAGNOSIS — L821 Other seborrheic keratosis: Secondary | ICD-10-CM | POA: Diagnosis not present

## 2020-08-14 DIAGNOSIS — D485 Neoplasm of uncertain behavior of skin: Secondary | ICD-10-CM | POA: Diagnosis not present

## 2020-09-28 ENCOUNTER — Other Ambulatory Visit: Payer: Self-pay | Admitting: Family Medicine

## 2020-11-02 DIAGNOSIS — D235 Other benign neoplasm of skin of trunk: Secondary | ICD-10-CM | POA: Diagnosis not present

## 2020-11-23 ENCOUNTER — Encounter: Payer: Self-pay | Admitting: Gastroenterology

## 2020-12-16 ENCOUNTER — Encounter: Payer: Self-pay | Admitting: Gastroenterology

## 2020-12-23 ENCOUNTER — Ambulatory Visit (AMBULATORY_SURGERY_CENTER): Payer: Self-pay | Admitting: *Deleted

## 2020-12-23 ENCOUNTER — Other Ambulatory Visit: Payer: Self-pay

## 2020-12-23 ENCOUNTER — Other Ambulatory Visit: Payer: Self-pay | Admitting: Gastroenterology

## 2020-12-23 VITALS — Ht 72.0 in | Wt 256.0 lb

## 2020-12-23 DIAGNOSIS — Z86010 Personal history of colonic polyps: Secondary | ICD-10-CM

## 2020-12-23 MED ORDER — SUPREP BOWEL PREP KIT 17.5-3.13-1.6 GM/177ML PO SOLN: 1.0000 | Freq: Once | ORAL | 0 refills | Status: DC

## 2020-12-23 NOTE — Progress Notes (Signed)

## 2020-12-29 ENCOUNTER — Encounter: Payer: Self-pay | Admitting: Gastroenterology

## 2021-01-06 ENCOUNTER — Encounter: Payer: Self-pay | Admitting: Gastroenterology

## 2021-01-06 ENCOUNTER — Other Ambulatory Visit: Payer: Self-pay

## 2021-01-06 ENCOUNTER — Ambulatory Visit (AMBULATORY_SURGERY_CENTER): Payer: 59 | Admitting: Gastroenterology

## 2021-01-06 VITALS — BP 126/60 | HR 53 | Temp 98.4°F | Resp 16 | Ht 72.0 in | Wt 256.0 lb

## 2021-01-06 DIAGNOSIS — Z1211 Encounter for screening for malignant neoplasm of colon: Secondary | ICD-10-CM | POA: Diagnosis not present

## 2021-01-06 DIAGNOSIS — D123 Benign neoplasm of transverse colon: Secondary | ICD-10-CM | POA: Diagnosis not present

## 2021-01-06 DIAGNOSIS — Z86010 Personal history of colonic polyps: Secondary | ICD-10-CM | POA: Diagnosis not present

## 2021-01-06 DIAGNOSIS — D124 Benign neoplasm of descending colon: Secondary | ICD-10-CM

## 2021-01-06 MED ORDER — SODIUM CHLORIDE 0.9 % IV SOLN: 500.0000 mL | Freq: Once | INTRAVENOUS | Status: DC

## 2021-01-06 NOTE — Progress Notes (Signed)
VS-CW  Pt's states no medical or surgical changes since previsit or office visit.  

## 2021-01-06 NOTE — Patient Instructions (Signed)
Await pathology  Please read over handouts about polyps and diverticulosis  Continue your normal medication  YOU HAD AN ENDOSCOPIC PROCEDURE TODAY AT [facility]:   Refer to the procedure report that was given to you for any specific questions about what was found during the examination.  If the procedure report does not answer your questions, please call your gastroenterologist to clarify.  If you requested that your care partner not be given the details of your procedure findings, then the procedure report has been included in a sealed envelope for you to review at your convenience later.  YOU SHOULD EXPECT: Some feelings of bloating in the abdomen. Passage of more gas than usual.  Walking can help get rid of the air that was put into your GI tract during the procedure and reduce the bloating. If you had a lower endoscopy (such as a colonoscopy or flexible sigmoidoscopy) you may notice spotting of blood in your stool or on the toilet paper. If you underwent a bowel prep for your procedure, you may not have a normal bowel movement for a few days.  Please Note:  You might notice some irritation and congestion in your nose or some drainage.  This is from the oxygen used during your procedure.  There is no need for concern and it should clear up in a day or so.  SYMPTOMS TO REPORT IMMEDIATELY:   Following lower endoscopy (colonoscopy or flexible sigmoidoscopy):  Excessive amounts of blood in the stool  Significant tenderness or worsening of abdominal pains  Swelling of the abdomen that is new, acute  Fever of 100F or higher  For urgent or emergent issues, a gastroenterologist can be reached at any hour by calling [phone]. Do not use MyChart messaging for urgent concerns.    DIET:  We do recommend a small meal at first, but then you may proceed to your regular diet.  Drink plenty of fluids but you should avoid alcoholic beverages for 24 hours.  ACTIVITY:  You should  plan to take it easy for the rest of today and you should NOT DRIVE or use heavy machinery until tomorrow (because of the sedation medicines used during the test).    FOLLOW UP: Our staff will call the number listed on your records 48-72 hours following your procedure to check on you and address any questions or concerns that you may have regarding the information given to you following your procedure. If we do not reach you, we will leave a message.  We will attempt to reach you two times.  During this call, we will ask if you have developed any symptoms of COVID 19. If you develop any symptoms (ie: fever, flu-like symptoms, shortness of breath, cough etc.) before then, please call [phone].  If you test positive for Covid 19 in the 2 weeks post procedure, please call and report this information to [place].    If any biopsies were taken you will be contacted by phone or by letter within the next 1-3 weeks.  Please call us at [phone] if you have not heard about the biopsies in 3 weeks.    SIGNATURES/CONFIDENTIALITY: You and/or your care partner have signed paperwork which will be entered into your electronic medical record.  These signatures attest to the fact that that the information above on your After Visit Summary has been reviewed and is understood.  Full responsibility of the confidentiality of this discharge information lies with you and/or your care-partner.

## 2021-01-06 NOTE — Progress Notes (Signed)
To [facility], VSS. Report to [email or web address]

## 2021-01-06 NOTE — Progress Notes (Signed)
Called to room to assist during endoscopic procedure.  Patient ID and intended procedure confirmed with present staff. Received instructions for my participation in the procedure from the performing physician.  

## 2021-01-06 NOTE — Op Note (Signed)
[place] Patient Name: Gary Collins Procedure Date: 01/06/2021 2:04 PM MRN: [id number] Endoscopist: Milus Banister , MD Age: 60 Referring MD:  Date of Birth: Jan 21, 1961 Gender: Male Account #: [id number] Procedure:                Colonoscopy Indications:              High risk colon cancer surveillance: Personal                            history of colonic polyps; Colonoscopy 2015 three                            subCM (mixed) SSPs and TAs; Colonoscopy 2018 four                            subCM (mixed) SSPs and TAs) Medicines:                Monitored Anesthesia Care Procedure:                Pre-Anesthesia Assessment:                           - Prior to the procedure, a History and Physical                            was performed, and patient medications and                            allergies were reviewed. The patient's tolerance of                            previous anesthesia was also reviewed. The risks                            and benefits of the procedure and the sedation                            options and risks were discussed with the patient.                            All questions were answered, and informed consent                            was obtained. Prior Anticoagulants: The patient has                            taken no previous anticoagulant or antiplatelet                            agents. ASA Grade Assessment: II - A patient with                            mild systemic disease. After reviewing the risks  and benefits, the patient was deemed in                            satisfactory condition to undergo the procedure.                           After obtaining informed consent, the colonoscope                            was passed under direct vision. Throughout the                            procedure, the patient's blood pressure, pulse, and                            oxygen saturations were monitored  continuously. The                            Colonoscope was introduced through the anus and                            advanced to the the cecum, identified by                            appendiceal orifice and ileocecal valve. The                            colonoscopy was performed without difficulty. The                            patient tolerated the procedure well. The quality                            of the bowel preparation was good. The ileocecal                            valve, appendiceal orifice, and rectum were                            photographed. Scope In: 2:10:25 PM Scope Out: 2:22:10 PM Scope Withdrawal Time: 0 hours 9 minutes 31 seconds  Total Procedure Duration: 0 hours 11 minutes 45 seconds  Findings:                 Three sessile polyps were found in the descending                            colon and transverse colon. The polyps were 2 to 3                            mm in size. These polyps were removed with a cold                            snare. Resection was complete, but the polyp tissue  was only partially retrieved.                           Multiple small-mouthed diverticula were found in                            the left colon.                           The exam was otherwise without abnormality on                            direct and retroflexion views. Complications:            No immediate complications. Estimated blood loss:                            None. Estimated Blood Loss:     Estimated blood loss: none. Impression:               - Three 2 to 3 mm polyps in the descending colon                            and in the transverse colon, removed with a cold                            snare. Complete resection. Partial retrieval.                           - Diverticulosis in the left colon.                           - The examination was otherwise normal on direct                            and retroflexion  views. Recommendation:           - Patient has a contact number available for                            emergencies. The signs and symptoms of potential                            delayed complications were discussed with the                            patient. Return to normal activities tomorrow.                            Written discharge instructions were provided to the                            patient.                           - Resume previous diet.                           -  Continue present medications.                           - Await pathology results. Milus Banister, MD 01/06/2021 2:25:41 PM This report has been signed electronically.

## 2021-01-08 ENCOUNTER — Telehealth: Payer: Self-pay

## 2021-01-08 NOTE — Telephone Encounter (Signed)
  Follow up Call-  Call back number 01/06/2021  Post procedure Call Back phone  # [phone]  Permission to leave phone message Yes  Some recent data might be hidden     Patient questions:  Do you have a fever, pain , or abdominal swelling? No. Pain Score  0 *  Have you tolerated food without any problems? Yes.    Have you been able to return to your normal activities? Yes.    Do you have any questions about your discharge instructions: Diet   No. Medications  No. Follow up visit  No.  Do you have questions or concerns about your Care? No.  Actions: * If pain score is 4 or above: No action needed, pain <4. 1. Have you developed a fever since your procedure? no  2.   Have you had an respiratory symptoms (SOB or cough) since your procedure? no  3.   Have you tested positive for COVID 19 since your procedure no  4.   Have you had any family members/close contacts diagnosed with the COVID 19 since your procedure?  no   If yes to any of these questions please route to Joylene John, RN and Joella Prince, RN

## 2021-01-14 ENCOUNTER — Encounter: Payer: Self-pay | Admitting: Gastroenterology

## 2021-01-17 MED FILL — Finasteride Tab 1 MG: ORAL | 90 days supply | Qty: 90 | Fill #0 | Status: AC

## 2021-01-18 ENCOUNTER — Other Ambulatory Visit (HOSPITAL_COMMUNITY): Payer: Self-pay

## 2021-01-20 ENCOUNTER — Other Ambulatory Visit (HOSPITAL_COMMUNITY): Payer: Self-pay

## 2021-03-10 ENCOUNTER — Other Ambulatory Visit (HOSPITAL_COMMUNITY): Payer: Self-pay

## 2021-03-10 MED FILL — Atorvastatin Calcium Tab 20 MG (Base Equivalent): ORAL | 90 days supply | Qty: 90 | Fill #0 | Status: AC

## 2021-04-26 ENCOUNTER — Other Ambulatory Visit (HOSPITAL_COMMUNITY): Payer: Self-pay

## 2021-04-26 ENCOUNTER — Other Ambulatory Visit: Payer: Self-pay | Admitting: Family Medicine

## 2021-04-26 MED ORDER — FINASTERIDE 1 MG PO TABS
ORAL_TABLET | Freq: Every day | ORAL | 0 refills | Status: DC
  Filled 2021-04-26: qty 90, 90d supply, fill #0

## 2021-04-27 ENCOUNTER — Other Ambulatory Visit (HOSPITAL_COMMUNITY): Payer: Self-pay

## 2021-05-10 ENCOUNTER — Other Ambulatory Visit (HOSPITAL_COMMUNITY): Payer: Self-pay

## 2021-05-10 MED ORDER — CARESTART COVID-19 HOME TEST VI KIT
PACK | 0 refills | Status: DC
  Filled 2021-05-10: qty 4, 4d supply, fill #0

## 2021-05-25 ENCOUNTER — Other Ambulatory Visit (HOSPITAL_COMMUNITY): Payer: Self-pay

## 2021-05-25 MED ORDER — SODIUM FLUORIDE 1.1 % DT PSTE
PASTE | DENTAL | 0 refills | Status: DC
  Filled 2021-05-25 – 2021-06-03 (×2): qty 100, 30d supply, fill #0

## 2021-06-02 ENCOUNTER — Other Ambulatory Visit (HOSPITAL_COMMUNITY): Payer: Self-pay

## 2021-06-03 ENCOUNTER — Other Ambulatory Visit (HOSPITAL_COMMUNITY): Payer: Self-pay

## 2021-07-07 ENCOUNTER — Other Ambulatory Visit: Payer: Self-pay

## 2021-07-07 ENCOUNTER — Ambulatory Visit: Payer: 59 | Attending: Internal Medicine

## 2021-07-07 DIAGNOSIS — Z23 Encounter for immunization: Secondary | ICD-10-CM

## 2021-07-29 ENCOUNTER — Encounter: Payer: 59 | Admitting: Family Medicine

## 2021-08-02 ENCOUNTER — Ambulatory Visit (INDEPENDENT_AMBULATORY_CARE_PROVIDER_SITE_OTHER): Payer: 59 | Admitting: Family Medicine

## 2021-08-02 ENCOUNTER — Other Ambulatory Visit (HOSPITAL_COMMUNITY): Payer: Self-pay

## 2021-08-02 ENCOUNTER — Other Ambulatory Visit: Payer: Self-pay

## 2021-08-02 ENCOUNTER — Encounter: Payer: Self-pay | Admitting: Family Medicine

## 2021-08-02 VITALS — BP 126/82 | HR 64 | Temp 97.8°F | Ht 77.0 in | Wt 246.2 lb

## 2021-08-02 DIAGNOSIS — Z23 Encounter for immunization: Secondary | ICD-10-CM | POA: Diagnosis not present

## 2021-08-02 DIAGNOSIS — Z125 Encounter for screening for malignant neoplasm of prostate: Secondary | ICD-10-CM | POA: Diagnosis not present

## 2021-08-02 DIAGNOSIS — E7800 Pure hypercholesterolemia, unspecified: Secondary | ICD-10-CM

## 2021-08-02 DIAGNOSIS — Z0000 Encounter for general adult medical examination without abnormal findings: Secondary | ICD-10-CM

## 2021-08-02 LAB — CBC WITH DIFFERENTIAL/PLATELET
Basophils Absolute: 0 10*3/uL (ref 0.0–0.1)
Basophils Relative: 0.7 % (ref 0.0–3.0)
Eosinophils Absolute: 0.1 10*3/uL (ref 0.0–0.7)
Eosinophils Relative: 2 % (ref 0.0–5.0)
HCT: 42.7 % (ref 39.0–52.0)
Hemoglobin: 14.3 g/dL (ref 13.0–17.0)
Lymphocytes Relative: 29.5 % (ref 12.0–46.0)
Lymphs Abs: 1.9 10*3/uL (ref 0.7–4.0)
MCHC: 33.6 g/dL (ref 30.0–36.0)
MCV: 88.5 fl (ref 78.0–100.0)
Monocytes Absolute: 0.5 10*3/uL (ref 0.1–1.0)
Monocytes Relative: 8.6 % (ref 3.0–12.0)
Neutro Abs: 3.8 10*3/uL (ref 1.4–7.7)
Neutrophils Relative %: 59.2 % (ref 43.0–77.0)
Platelets: 202 10*3/uL (ref 150.0–400.0)
RBC: 4.83 Mil/uL (ref 4.22–5.81)
RDW: 13.3 % (ref 11.5–15.5)
WBC: 6.4 10*3/uL (ref 4.0–10.5)

## 2021-08-02 LAB — COMPREHENSIVE METABOLIC PANEL
ALT: 17 U/L (ref 0–53)
AST: 29 U/L (ref 0–37)
Albumin: 4.6 g/dL (ref 3.5–5.2)
Alkaline Phosphatase: 66 U/L (ref 39–117)
BUN: 13 mg/dL (ref 6–23)
CO2: 26 mEq/L (ref 19–32)
Calcium: 9.4 mg/dL (ref 8.4–10.5)
Chloride: 103 mEq/L (ref 96–112)
Creatinine, Ser: 0.87 mg/dL (ref 0.40–1.50)
GFR: 94.75 mL/min (ref >60.00)
Glucose, Bld: 92 mg/dL (ref 70–99)
Potassium: 4.2 mEq/L (ref 3.5–5.1)
Sodium: 138 mEq/L (ref 135–145)
Total Bilirubin: 1.2 mg/dL (ref 0.2–1.2)
Total Protein: 7.5 g/dL (ref 6.0–8.3)

## 2021-08-02 LAB — TSH: TSH: 1.38 u[IU]/mL (ref 0.35–5.50)

## 2021-08-02 LAB — LIPID PANEL
Cholesterol: 162 mg/dL (ref 0–200)
HDL: 39.7 mg/dL (ref >39.00)
LDL Cholesterol: 87 mg/dL (ref 0–99)
NonHDL: 122.15
Total CHOL/HDL Ratio: 4
Triglycerides: 174 mg/dL — ABNORMAL HIGH (ref 0.0–149.0)
VLDL: 34.8 mg/dL (ref 0.0–40.0)

## 2021-08-02 LAB — PSA: PSA: 0.58 ng/mL (ref 0.10–4.00)

## 2021-08-02 MED ORDER — ATORVASTATIN CALCIUM 20 MG PO TABS
ORAL_TABLET | Freq: Every day | ORAL | 3 refills | Status: DC
  Filled 2021-08-02: qty 90, 90d supply, fill #0
  Filled 2021-12-08: qty 30, 30d supply, fill #1
  Filled 2022-03-29: qty 30, 30d supply, fill #2
  Filled 2022-05-11: qty 30, 30d supply, fill #3
  Filled 2022-06-06: qty 30, 30d supply, fill #4
  Filled 2022-07-15: qty 30, 30d supply, fill #5

## 2021-08-02 MED ORDER — FINASTERIDE 1 MG PO TABS
ORAL_TABLET | Freq: Every day | ORAL | 3 refills | Status: DC
  Filled 2021-08-02: qty 90, 90d supply, fill #0
  Filled 2021-12-08: qty 90, 90d supply, fill #1
  Filled 2022-03-29: qty 90, 90d supply, fill #2
  Filled 2022-06-06 – 2022-07-15 (×2): qty 90, 90d supply, fill #3

## 2021-08-02 NOTE — Progress Notes (Signed)
Office Note 08/02/2021  CC: cpe/fu RCI  HPI:  Patient is a 60 y.o. male who is here for annual health maintenance exam and f/u HLD, on statin.  Home bp's consistently normal.  Compliant with statin.  Past Medical History:  Diagnosis Date   Adenomatous colon polyp 11/18/13; 07/10/17   Recall 3-5 yrs   Allergy    Baker's cyst of knee, left 07/2019   Gross hematuria 06/2013   Right kidney radiolucent stone believed to be uric acid stone, cysto normal:  potassium citrate ER 20 mEQ (urocit K) bid by urology   History of kidney stones    Hyperlipidemia, mixed 2017   TLC recommended 2017 and 2018.  Good response to statin 2019. Stable 2020->ok for pt to take statin qod.   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  Followed by Dr. Loanne Drilling.   No changes on u/s f/u 09/2012.  Repeat u/s planned by Dr. Loanne Drilling as of 02/2017 f/u.    Past Surgical History:  Procedure Laterality Date   BIOPSY THYROID     COLONOSCOPY     COLONOSCOPY W/ POLYPECTOMY  07/10/2017   2018 and 12/2020->Adenomatous: recall 3-5 yrs   EXTRACORPOREAL SHOCK WAVE LITHOTRIPSY Right 10/17/2016   Procedure: RIGHT EXTRACORPOREAL SHOCK WAVE LITHOTRIPSY (ESWL);  Surgeon: Carolan Clines, MD;  Location: [facility];  Service: Urology;  Laterality: Right;   KNEE ARTHROSCOPY     left   POLYPECTOMY      Family History  Problem Relation Age of Onset   Cancer Father        bladder   Colon cancer Neg Hx    Stomach cancer Neg Hx    Colon polyps Neg Hx    Esophageal cancer Neg Hx    Rectal cancer Neg Hx     Social History   Socioeconomic History   Marital  status: Married    Spouse name: Not on file   Number of children: Not on file   Years of education: Not on file   Highest education level: Not on file  Occupational History   Not on file  Tobacco Use   Smoking status: Never   Smokeless tobacco: Never  Vaping Use   Vaping Use: Never used  Substance and Sexual Activity   Alcohol use: Yes    Alcohol/week: 12.0 - 18.0 standard drinks    Types: 12 - 18 Cans of beer per week    Comment: 12-18 beers per week   Drug use: No   Sexual activity: Not on file  Other Topics Concern   Not on file  Social History Narrative   Married, 59 y/o girl and 54 boy.   Occupation: Clinical biochemist (residential).   Orig from [place], [place].   Enjoys fishing.  Exercises 2-3 times per week: 30 min elliptical.  Also plays soccer.   No tob, occ beer, no drugs.         Social Determinants of Health   Financial Resource Strain: Not on file  Food Insecurity: Not on file  Transportation Needs: Not on file  Physical Activity: Not on file  Stress: Not on  file  Social Connections: Not on file  Intimate Partner Violence: Not on file    Outpatient Medications Prior to Visit  Medication Sig Dispense Refill   atorvastatin (LIPITOR) 20 MG tablet TAKE 1 TABLET BY MOUTH ONCE A DAY 90 tablet 1   finasteride (PROPECIA) 1 MG tablet TAKE 1 TABLET BY MOUTH ONCE DAILY 90 tablet 0   Omega-3 Fatty Acids (FISH OIL PO) Take 4 tablets by mouth daily.     Sodium Fluoride 1.1 % PSTE Brush teeth 3 times daily after meals and before bedtime. 100 mL 0   cetirizine (ZYRTEC) 10 MG tablet Take 10 mg by mouth daily. (Patient not taking: No sig reported)     COVID-19 At Home Antigen Test (CARESTART COVID-19 HOME TEST) KIT Use as directed within package instructions (Patient not taking: Reported on 08/02/2021) 4 each 0   SUPREP BOWEL PREP KIT 17.5-3.13-1.6 GM/177ML SOLN USE AS DIRECTED (Patient not taking: Reported on 08/02/2021) 354 mL 0   No facility-administered medications  prior to visit.    Allergies  Allergen Reactions   Beta Adrenergic Blockers Other (See Comments)    Not good for me per dr.   Ibuprofen     REACTION: lip swelling   Nsaids Other (See Comments)    Lip swelling    ROS Review of Systems  PE; Vitals with BMI 08/02/2021 01/06/2021 01/06/2021  Height $Remov'6\' 5"'dDRDCR$  - -  Weight 246 lbs 3 oz - -  BMI 35.00 - -  Systolic 938 182 993  Diastolic 82 60 49  Pulse 64 53 59   Gen: Alert, well appearing.  Patient is oriented to person, place, time, and situation. AFFECT: pleasant, lucid thought and speech. ENT: Ears: EACs clear, normal epithelium.  TMs with good light reflex and landmarks bilaterally.  Eyes: no injection, icteris, swelling, or exudate.  EOMI, PERRLA. Nose: no drainage or turbinate edema/swelling.  No injection or focal lesion.  Mouth: lips without lesion/swelling.  Oral mucosa pink and moist.  Dentition intact and without obvious caries or gingival swelling.  Oropharynx without erythema, exudate, or swelling.  Neck: supple/nontender.  No LAD, mass, or TM.  Carotid pulses 2+ bilaterally, without bruits. CV: RRR, no m/r/g.   LUNGS: CTA bilat, nonlabored resps, good aeration in all lung fields. ABD: soft, NT, ND, BS normal.  No hepatospenomegaly or mass.  No bruits. EXT: no clubbing, cyanosis, or edema.  Musculoskeletal: no joint swelling, erythema, warmth, or tenderness.  ROM of all joints intact. Skin - no sores or suspicious lesions or rashes or color changes   Pertinent labs:  Lab Results  Component Value Date   TSH 1.56 07/20/2020   Lab Results  Component Value Date   WBC 6.2 07/20/2020   HGB 14.5 07/20/2020   HCT 43.9 07/20/2020   MCV 91.6 07/20/2020   PLT 181.0 07/20/2020   Lab Results  Component Value Date   CREATININE 1.07 07/20/2020   BUN 14 07/20/2020   NA 138 07/20/2020   K 4.3 07/20/2020   CL 103 07/20/2020   CO2 29 07/20/2020   Lab Results  Component Value Date   ALT 13 07/20/2020   AST 24 07/20/2020    ALKPHOS 60 07/20/2020   BILITOT 0.9 07/20/2020   Lab Results  Component Value Date   CHOL 148 07/20/2020   Lab Results  Component Value Date   HDL 39.60 07/20/2020   Lab Results  Component Value Date   LDLCALC 74 07/20/2020   Lab Results  Component Value  Date   TRIG 172.0 (H) 07/20/2020   Lab Results  Component Value Date   CHOLHDL 4 07/20/2020   Lab Results  Component Value Date   PSA 0.55 07/20/2020   PSA 0.51 07/19/2019   PSA 0.8 07/16/2018   ASSESSMENT AND PLAN:   Health maintenance exam: Reviewed age and gender appropriate health maintenance issues (prudent diet, regular exercise, health risks of tobacco and excessive alcohol, use of seatbelts, fire alarms in home, use of sunscreen).  Also reviewed age and gender appropriate health screening as well as vaccine recommendations. Vaccines: flu-->given today.  Otherwise UTD. Labs: fasting HP + PSA today. Prostate ca screening: PSA today. Colon ca screening: hx adenoma, most recent 12/2020-->recall 5 yrs.  An After Visit Summary was printed and given to the patient.  FOLLOW UP:  No follow-ups on file.  Signed:  Crissie Sickles, MD           08/02/2021

## 2021-08-03 ENCOUNTER — Other Ambulatory Visit (HOSPITAL_COMMUNITY): Payer: Self-pay

## 2021-08-20 ENCOUNTER — Other Ambulatory Visit (HOSPITAL_COMMUNITY): Payer: Self-pay

## 2021-08-20 DIAGNOSIS — L821 Other seborrheic keratosis: Secondary | ICD-10-CM | POA: Diagnosis not present

## 2021-08-20 DIAGNOSIS — D1801 Hemangioma of skin and subcutaneous tissue: Secondary | ICD-10-CM | POA: Diagnosis not present

## 2021-08-20 DIAGNOSIS — D2260 Melanocytic nevi of unspecified upper limb, including shoulder: Secondary | ICD-10-CM | POA: Diagnosis not present

## 2021-08-20 DIAGNOSIS — L814 Other melanin hyperpigmentation: Secondary | ICD-10-CM | POA: Diagnosis not present

## 2021-08-20 MED ORDER — CLOBETASOL PROPIONATE 0.05 % EX OINT
TOPICAL_OINTMENT | CUTANEOUS | 3 refills | Status: AC
  Filled 2021-08-20: qty 60, 10d supply, fill #0

## 2021-09-28 ENCOUNTER — Other Ambulatory Visit (HOSPITAL_COMMUNITY): Payer: Self-pay

## 2021-09-28 MED ORDER — CARESTART COVID-19 HOME TEST VI KIT
PACK | 0 refills | Status: DC
  Filled 2021-09-28: qty 4, 4d supply, fill #0

## 2021-10-01 ENCOUNTER — Telehealth: Payer: 59 | Admitting: Physician Assistant

## 2021-10-01 DIAGNOSIS — L0591 Pilonidal cyst without abscess: Secondary | ICD-10-CM | POA: Diagnosis not present

## 2021-10-01 MED ORDER — MUPIROCIN CALCIUM 2 % EX CREA: 1.0000 "application " | TOPICAL_CREAM | Freq: Two times a day (BID) | CUTANEOUS | 0 refills | Status: DC

## 2021-10-01 NOTE — Progress Notes (Signed)
E Visit for Rash  We are sorry that you are not feeling well. Here is how we plan to help!  It sounds like you may have a recurrence of a pilonidal cyst. I have prescribed:  Topical mupiricin cream   HOME CARE:  Take cool showers and avoid direct sunlight. Apply cool compress or wet dressings. Take a bath in an oatmeal bath.  Sprinkle content of one Aveeno packet under running faucet with comfortably warm water.  Bathe for 15-20 minutes, 1-2 times daily.  Pat dry with a towel. Do not rub the rash. Use hydrocortisone cream. Take an antihistamine like Benadryl for widespread rashes that itch.  The adult dose of Benadryl is 25-50 mg by mouth 4 times daily. Caution:  This type of medication may cause sleepiness.  Do not drink alcohol, drive, or operate dangerous machinery while taking antihistamines.  Do not take these medications if you have prostate enlargement.  Read package instructions thoroughly on all medications that you take.  GET HELP RIGHT AWAY IF:  Symptoms don't go away after treatment. Severe itching that persists. If you rash spreads or swells. If you rash begins to smell. If it blisters and opens or develops a yellow-brown crust. You develop a fever. You have a sore throat. You become short of breath.  MAKE SURE YOU:  Understand these instructions. Will watch your condition. Will get help right away if you are not doing well or get worse.  Thank you for choosing an e-visit.  Your e-visit answers were reviewed by a board certified advanced clinical practitioner to complete your personal care plan. Depending upon the condition, your plan could have included both over the counter or prescription medications.  Please review your pharmacy choice. Make sure the pharmacy is open so you can pick up prescription now. If there is a problem, you may contact your provider through [employer] and have the prescription routed to another pharmacy.  Your safety is important  to [place]. If you have drug allergies check your prescription carefully.   For the next 24 hours you can use MyChart to ask questions about today's visit, request a non-urgent call back, or ask for a work or school excuse. You will get an email in the next two days asking about your experience. I hope that your e-visit has been valuable and will speed your recovery.  I provided 5 minutes of non face-to-face time during this encounter for chart review and documentation.

## 2021-10-05 ENCOUNTER — Telehealth: Payer: Self-pay

## 2021-10-05 NOTE — Telephone Encounter (Signed)
Pt had e-visit and rx sent to pharmacy on 12/23.

## 2021-10-05 NOTE — Telephone Encounter (Signed)
[facility] - Client TELEPHONE ADVICE RECORD [facility] Patient Name: Gary MUELL ER Gender: Male DOB: April 12, 1961 Age: 60 Y 2 M 21 D Return Phone Number: [phone] (Primary) Address[place] [address] Client [facility] - Client Client Site [facility] - Day Provider Crissie Sickles - MD Contact Type Call Who Is Calling Patient / Member / Family / Caregiver Call Type Triage / Clinical Relationship To Patient Self Return Phone Number [phone] (Primary) Chief Complaint Back Pain - General Reason for Call Medication Question / Request Initial Comment Caller states he's having an issue with lower back and he has a bump he gets ever 3-4years and he's out of the cream and would it sent to pharmacy. Translation No Nurse Assessment Nurse: Merlyn Lot, RN, Lilly Date/Time (Eastern Time): 10/01/2021 6:33:08 PM Confirm and document reason for call. If symptomatic, describe symptoms. ---Caller states he gets a cyst intermittently between gluteal folds. States this episode started last night. He reports pain and pressure. He states usually he gets an rx of Mupirocin ointment. Does the patient have any new or worsening symptoms? ---Yes Will a triage be completed? ---Yes Related visit to physician within the last 2 weeks? ---No Does the PT have any chronic conditions? (i.e. diabetes, asthma, this includes High risk factors for pregnancy, etc.) ---No Is this a behavioral health or substance abuse call? ---No Guidelines Guideline Title Affirmed Question Affirmed Notes Nurse Date/Time (Eastern Time) Boil (Skin Abscess) Boil > 2 inches across (> 5 cm; larger than a golf ball or ping pong ball) Merlyn Lot, RN, Lilly 10/01/2021 6:35:41 PM Disp. Time Eilene Ghazi Time) Disposition Final User 10/01/2021 6:50:37 PM Paged On Call back to [facility], [place], Ralph Leyden 10/01/2021 7:04:41  PM Paged On Call back to [facility], [place], Ralph Leyden 10/01/2021 6:47:44 PM See PCP within 24 Hours Yes Merlyn Lot, RN, Lilly PLEASE NOTE: All timestamps contained within this report are represented as [place] Standard Time. CONFIDENTIALTY NOTICE: This fax transmission is intended only for the addressee. It contains information that is legally privileged, confidential or otherwise protected from use or disclosure. If you are not the intended recipient, you are strictly prohibited from reviewing, disclosing, copying using or disseminating any of this information or taking any action in reliance on or regarding this information. If you have received this fax in error, please notify us immediately by telephone so that we can arrange for its return to [place]. Phone: [phone], Toll-Free: [phone], Fax: [phone] Page: 2 of 2 Call Id: [id number] [place] Disagree/Comply Comply Caller Understands Yes PreDisposition Call another nurse Care Advice Given Per Guideline SEE PCP WITHIN 24 HOURS: * IF OFFICE WILL BE OPEN: You need to be examined within the next 24 hours. Call your doctor (or NP/PA) when the office opens and make an appointment. * IF OFFICE WILL BE CLOSED: You need to be seen within the next 24 hours. A clinic or an urgent care center is often a good source of care if your doctor's office is closed or you can't get an appointment. TREATMENT - APPLY ANTIBIOTIC OINTMENT: * Apply an over-the-counter antibiotic ointment (e.g., Bacitracin) to the area of redness. CALL BACK IF: * Severe pain or fever occurs * Widespread rash occurs * You become worse CARE ADVICE per Boil (Skin Abscess) (Adult) guideline. Comments User: Mack Hook Date/Time Eilene Ghazi Time): 10/01/2021 4:36:01 PM Caller called back again, wants to make sure that he hasn't been gotten forgotten about. Pharmacy closes  at 9p. Needs this cream badly. User: Sharlee Blew, RN Date/Time Eilene Ghazi Time): 10/01/2021 7:06:16  PM I sent a text to the on call provider requesting an rx for the antibiotic cream but then the caller called me back and stated he was able to obtain the rx through an E Visit. I have cancelled the call to the on call provider. Referrals GO TO FACILITY UNDECIDED Paging DoctorName Phone DateTime Result/ Outcome Message Type Notes Scarlette Calico - MD [phone] 10/01/2021 6:50:37 PM Paged On Call Back to Call Center Doctor Paged Good evening. Can you assist with calling in an antibiotic ointment for a patient? Please call me back at [phone]. Thanks, Maryelizabeth Kaufmann Rn Scarlette Calico - MD [phone] 10/01/2021 7:04:41 PM Paged On Call Back to Call Center Doctor Paged Please disregard my last message as the caller has obtained an E Visit to get the rx. I appreciate your time. Thanks, Maryelizabeth Kaufmann RN Scarlette Calico - MD 10/01/2021 7:05:06 PM [facility] - No Reply Required Message Result

## 2021-12-08 ENCOUNTER — Other Ambulatory Visit (HOSPITAL_COMMUNITY): Payer: Self-pay

## 2021-12-09 ENCOUNTER — Other Ambulatory Visit (HOSPITAL_COMMUNITY): Payer: Self-pay

## 2022-01-11 DIAGNOSIS — H35711 Central serous chorioretinopathy, right eye: Secondary | ICD-10-CM | POA: Diagnosis not present

## 2022-03-30 ENCOUNTER — Other Ambulatory Visit (HOSPITAL_COMMUNITY): Payer: Self-pay

## 2022-03-31 ENCOUNTER — Other Ambulatory Visit (HOSPITAL_COMMUNITY): Payer: Self-pay

## 2022-05-11 ENCOUNTER — Other Ambulatory Visit (HOSPITAL_COMMUNITY): Payer: Self-pay

## 2022-05-12 ENCOUNTER — Other Ambulatory Visit (HOSPITAL_COMMUNITY): Payer: Self-pay

## 2022-06-06 ENCOUNTER — Other Ambulatory Visit (HOSPITAL_COMMUNITY): Payer: Self-pay

## 2022-07-15 ENCOUNTER — Other Ambulatory Visit (HOSPITAL_COMMUNITY): Payer: Self-pay

## 2022-07-18 ENCOUNTER — Other Ambulatory Visit (HOSPITAL_COMMUNITY): Payer: Self-pay

## 2022-07-25 ENCOUNTER — Other Ambulatory Visit (HOSPITAL_COMMUNITY): Payer: Self-pay

## 2022-09-22 ENCOUNTER — Encounter: Payer: Self-pay | Admitting: Family Medicine

## 2022-09-22 ENCOUNTER — Other Ambulatory Visit: Payer: Self-pay | Admitting: Family Medicine

## 2022-09-22 ENCOUNTER — Ambulatory Visit (INDEPENDENT_AMBULATORY_CARE_PROVIDER_SITE_OTHER): Payer: 59 | Admitting: Family Medicine

## 2022-09-22 VITALS — BP 135/83 | HR 51 | Ht 73.0 in | Wt 251.8 lb

## 2022-09-22 DIAGNOSIS — Z23 Encounter for immunization: Secondary | ICD-10-CM

## 2022-09-22 DIAGNOSIS — Z0000 Encounter for general adult medical examination without abnormal findings: Secondary | ICD-10-CM

## 2022-09-22 DIAGNOSIS — Z125 Encounter for screening for malignant neoplasm of prostate: Secondary | ICD-10-CM | POA: Diagnosis not present

## 2022-09-22 DIAGNOSIS — E7800 Pure hypercholesterolemia, unspecified: Secondary | ICD-10-CM

## 2022-09-22 MED ORDER — FINASTERIDE 1 MG PO TABS: ORAL_TABLET | Freq: Every day | ORAL | 3 refills | Status: AC

## 2022-09-22 MED ORDER — ATORVASTATIN CALCIUM 20 MG PO TABS
20.0000 mg | ORAL_TABLET | Freq: Every day | ORAL | 3 refills | Status: DC
  Filled 2022-10-27 – 2022-11-07 (×2): qty 30, 30d supply, fill #0
  Filled 2022-12-04: qty 30, 30d supply, fill #1
  Filled 2023-01-02 – 2023-01-06 (×2): qty 30, 30d supply, fill #2
  Filled 2023-02-21: qty 30, 30d supply, fill #3
  Filled 2023-03-27: qty 30, 30d supply, fill #4
  Filled 2023-04-22: qty 30, 30d supply, fill #5
  Filled 2023-05-23: qty 30, 30d supply, fill #6
  Filled 2023-06-22: qty 30, 30d supply, fill #7
  Filled 2023-07-20: qty 30, 30d supply, fill #8
  Filled 2023-08-18: qty 30, 30d supply, fill #9
  Filled 2023-09-15: qty 30, 30d supply, fill #10

## 2022-09-22 NOTE — Progress Notes (Addendum)
Office Note 09/22/2022  CC:  Chief Complaint  Patient presents with   Annual Exam    Pt is not fasting    HPI:  Patient is a 61 y.o. male who is here for annual health maintenance exam and follow-up hyperlipidemia.  Feeling well.  No problems taking atorvastatin 20 mg a day. 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   Baker's cyst of knee, left 07/2019   Gross hematuria 06/2013   Right kidney radiolucent stone believed to be uric acid stone, cysto normal:  potassium citrate ER 20 mEQ (urocit K) bid by urology   History of kidney stones    Hyperlipidemia, mixed 2017   TLC recommended 2017 and 2018.  Good response to statin 2019. Stable 2020->ok for pt to take statin qod.   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  Followed by Dr. Loanne Drilling.   No changes on u/s f/u 09/2012.  Repeat u/s planned by Dr. Loanne Drilling as of 02/2017 f/u.    Past Surgical History:  Procedure Laterality Date   BIOPSY THYROID     COLONOSCOPY     COLONOSCOPY W/ POLYPECTOMY  07/10/2017   2018 and 12/2020->Adenomatous: recall 3-5 yrs   EXTRACORPOREAL SHOCK WAVE LITHOTRIPSY Right 10/17/2016   Procedure: RIGHT EXTRACORPOREAL SHOCK WAVE LITHOTRIPSY (ESWL);  Surgeon: Carolan Clines, MD;  Location: [facility];  Service: Urology;  Laterality: Right;   KNEE ARTHROSCOPY     left   POLYPECTOMY      Family History  Problem Relation Age of Onset   Cancer Father        bladder   Colon cancer Neg Hx    Stomach cancer  Neg Hx    Colon polyps Neg Hx    Esophageal cancer Neg Hx    Rectal cancer Neg Hx     Social History   Socioeconomic History   Marital status: Married    Spouse name: Not on file   Number of children: Not on file   Years of education: Not on file   Highest education level: Not on file  Occupational History   Not on file  Tobacco Use   Smoking status: Never   Smokeless tobacco: Never  Vaping Use   Vaping Use: Never used  Substance and Sexual Activity   Alcohol use: Yes    Alcohol/week: 12.0 - 18.0 standard drinks of alcohol    Types: 12 - 18 Cans of beer per week    Comment: 12-18 beers per week   Drug use: No   Sexual activity: Not on file  Other Topics Concern   Not on file  Social History Narrative   Married, 60 y/o girl and 101 boy.   Occupation: Clinical biochemist (residential).   Orig from [place], [place].   Enjoys fishing.  Exercises 2-3 times per week: 30 min elliptical.  Also plays soccer.   No tob, occ beer, no drugs.         Social Determinants of Health  Financial Resource Strain: Not on file  Food Insecurity: Not on file  Transportation Needs: Not on file  Physical Activity: Not on file  Stress: Not on file  Social Connections: Not on file  Intimate Partner Violence: Not on file    Outpatient Medications Prior to Visit  Medication Sig Dispense Refill   clobetasol ointment (TEMOVATE) 0.05 % Apply 2 times a day to lesion on left upper arm.  Stop when smooth. Do not apply to face or skin folds. 60 g 3   mupirocin cream (BACTROBAN) 2 % Apply 1 application topically 2 (two) times daily. 15 g 0   Omega-3 Fatty Acids (FISH OIL PO) Take 4 tablets by mouth daily.     Sodium Fluoride 1.1 % PSTE Brush teeth 3 times daily after meals and before bedtime. 100 mL 0   finasteride (PROPECIA) 1 MG tablet TAKE 1 TABLET BY MOUTH ONCE DAILY 90 tablet 3   cetirizine (ZYRTEC) 10 MG tablet Take 10 mg by mouth daily. (Patient not taking: Reported on 07/20/2020)      atorvastatin (LIPITOR) 20 MG tablet TAKE 1 TABLET BY MOUTH ONCE A DAY 90 tablet 3   COVID-19 At Home Antigen Test (CARESTART COVID-19 HOME TEST) KIT Use as directed within package instructions (Patient not taking: Reported on 08/02/2021) 4 each 0   COVID-19 At Home Antigen Test (CARESTART COVID-19 HOME TEST) KIT Use as directed within package instructions. (Patient not taking: Reported on 09/22/2022) 4 each 0   No facility-administered medications prior to visit.    Allergies  Allergen Reactions   Beta Adrenergic Blockers Other (See Comments)    Not good for me per dr.   Ibuprofen     REACTION: lip swelling   Nsaids Other (See Comments)    Lip swelling    ROS Review of Systems  Constitutional:  Negative for appetite change, chills, fatigue and fever.  HENT:  Negative for congestion, dental problem, ear pain and sore throat.   Eyes:  Negative for discharge, redness and visual disturbance.  Respiratory:  Negative for cough, chest tightness, shortness of breath and wheezing.   Cardiovascular:  Negative for chest pain, palpitations and leg swelling.  Gastrointestinal:  Negative for abdominal pain, blood in stool, diarrhea, nausea and vomiting.  Genitourinary:  Negative for difficulty urinating, dysuria, flank pain, frequency, hematuria and urgency.  Musculoskeletal:  Negative for arthralgias, back pain, joint swelling, myalgias and neck stiffness.  Skin:  Negative for pallor and rash.  Neurological:  Negative for dizziness, speech difficulty, weakness and headaches.  Hematological:  Negative for adenopathy. Does not bruise/bleed easily.  Psychiatric/Behavioral:  Negative for confusion and sleep disturbance. The patient is not nervous/anxious.     PE;    09/22/2022    1:55 PM 08/02/2021   11:09 AM 01/06/2021    2:44 PM  Vitals with BMI  Height _0  _1    Weight 251 lbs 13 oz 246 lbs 3 oz   BMI 81.10 31.59   Systolic 458 592 924  Diastolic 83 82 60  Pulse 51 64 53   Gen:  Alert, well appearing.  Patient is oriented to person, place, time, and situation. AFFECT: pleasant, lucid thought and speech. ENT: Ears: EACs clear, normal epithelium.  TMs with good light reflex and landmarks bilaterally.  Eyes: no injection, icteris, swelling, or exudate.  EOMI, PERRLA. Nose: no drainage or turbinate edema/swelling.  No injection or focal lesion.  Mouth: lips without lesion/swelling.  Oral mucosa pink and moist.  Dentition intact  and without obvious caries or gingival swelling.  Oropharynx without erythema, exudate, or swelling.  Neck: supple/nontender.  No LAD, mass, or TM.  Carotid pulses 2+ bilaterally, without bruits. CV: RRR, no m/r/g.   LUNGS: CTA bilat, nonlabored resps, good aeration in all lung fields. ABD: soft, NT, ND, BS normal.  No hepatospenomegaly or mass.  No bruits. EXT: no clubbing, cyanosis, or edema.  Musculoskeletal: no joint swelling, erythema, warmth, or tenderness.  ROM of all joints intact. Skin - no sores or suspicious lesions or rashes or color changes  Pertinent labs:  Lab Results  Component Value Date   TSH 1.38 08/02/2021   Lab Results  Component Value Date   WBC 6.4 08/02/2021   HGB 14.3 08/02/2021   HCT 42.7 08/02/2021   MCV 88.5 08/02/2021   PLT 202.0 08/02/2021   Lab Results  Component Value Date   CREATININE 0.87 08/02/2021   BUN 13 08/02/2021   NA 138 08/02/2021   K 4.2 08/02/2021   CL 103 08/02/2021   CO2 26 08/02/2021   Lab Results  Component Value Date   ALT 17 08/02/2021   AST 29 08/02/2021   ALKPHOS 66 08/02/2021   BILITOT 1.2 08/02/2021   Lab Results  Component Value Date   CHOL 162 08/02/2021   Lab Results  Component Value Date   HDL 39.70 08/02/2021   Lab Results  Component Value Date   LDLCALC 87 08/02/2021   Lab Results  Component Value Date   TRIG 174.0 (H) 08/02/2021   Lab Results  Component Value Date   CHOLHDL 4 08/02/2021   Lab Results  Component Value Date   PSA 0.58 08/02/2021    PSA 0.55 07/20/2020   PSA 0.51 07/19/2019   ASSESSMENT AND PLAN:   Health maintenance exam: Reviewed age and gender appropriate health maintenance issues (prudent diet, regular exercise, health risks of tobacco and excessive alcohol, use of seatbelts, fire alarms in home, use of sunscreen).  Also reviewed age and gender appropriate health screening as well as vaccine recommendations. Vaccines: Up-to-date Labs: fasting HP, Hba1c, and PSA today. Prostate ca screening: PSA today. Colon ca screening: hx adenoma, most recent 12/2020-->recall 5 yrs  An After Visit Summary was printed and given to the patient.  FOLLOW UP:  Return in about 1 year (around 09/23/2023) for annual CPE (fasting).  Signed:  Crissie Sickles, MD           09/22/2022

## 2022-09-22 NOTE — Patient Instructions (Signed)
Health Maintenance, Male Adopting a healthy lifestyle and getting preventive care are important in promoting health and wellness. Ask your health care provider about: The right schedule for you to have regular tests and exams. Things you can do on your own to prevent diseases and keep yourself healthy. What should I know about diet, weight, and exercise? Eat a healthy diet  Eat a diet that includes plenty of vegetables, fruits, low-fat dairy products, and lean protein. Do not eat a lot of foods that are high in solid fats, added sugars, or sodium. Maintain a healthy weight Body mass index (BMI) is a measurement that can be used to identify possible weight problems. It estimates body fat based on height and weight. Your health care provider can help determine your BMI and help you achieve or maintain a healthy weight. Get regular exercise Get regular exercise. This is one of the most important things you can do for your health. Most adults should: Exercise for at least 150 minutes each week. The exercise should increase your heart rate and make you sweat (moderate-intensity exercise). Do strengthening exercises at least twice a week. This is in addition to the moderate-intensity exercise. Spend less time sitting. Even light physical activity can be beneficial. Watch cholesterol and blood lipids Have your blood tested for lipids and cholesterol at 60 years of age, then have this test every 5 years. You may need to have your cholesterol levels checked more often if: Your lipid or cholesterol levels are high. You are older than 60 years of age. You are at high risk for heart disease. What should I know about cancer screening? Many types of cancers can be detected early and may often be prevented. Depending on your health history and family history, you may need to have cancer screening at various ages. This may include screening for: Colorectal cancer. Prostate cancer. Skin cancer. Lung  cancer. What should I know about heart disease, diabetes, and high blood pressure? Blood pressure and heart disease High blood pressure causes heart disease and increases the risk of stroke. This is more likely to develop in people who have high blood pressure readings or are overweight. Talk with your health care provider about your target blood pressure readings. Have your blood pressure checked: Every 3-5 years if you are 18-39 years of age. Every year if you are 40 years old or older. If you are between the ages of 65 and 75 and are a current or former smoker, ask your health care provider if you should have a one-time screening for abdominal aortic aneurysm (AAA). Diabetes Have regular diabetes screenings. This checks your fasting blood sugar level. Have the screening done: Once every three years after age 45 if you are at a normal weight and have a low risk for diabetes. More often and at a younger age if you are overweight or have a high risk for diabetes. What should I know about preventing infection? Hepatitis B If you have a higher risk for hepatitis B, you should be screened for this virus. Talk with your health care provider to find out if you are at risk for hepatitis B infection. Hepatitis C Blood testing is recommended for: Everyone born from 1945 through 1965. Anyone with known risk factors for hepatitis C. Sexually transmitted infections (STIs) You should be screened each year for STIs, including gonorrhea and chlamydia, if: You are sexually active and are younger than 60 years of age. You are older than 60 years of age and your   health care provider tells you that you are at risk for this type of infection. Your sexual activity has changed since you were last screened, and you are at increased risk for chlamydia or gonorrhea. Ask your health care provider if you are at risk. Ask your health care provider about whether you are at high risk for HIV. Your health care provider  may recommend a prescription medicine to help prevent HIV infection. If you choose to take medicine to prevent HIV, you should first get tested for HIV. You should then be tested every 3 months for as long as you are taking the medicine. Follow these instructions at home: Alcohol use Do not drink alcohol if your health care provider tells you not to drink. If you drink alcohol: Limit how much you have to 0-2 drinks a day. Know how much alcohol is in your drink. In the [place] one drink equals one 12 oz bottle of beer (355 mL), one 5 oz glass of wine (148 mL), or one 1 oz glass of hard liquor (44 mL). Lifestyle Do not use any products that contain nicotine or tobacco. These products include cigarettes, chewing tobacco, and vaping devices, such as e-cigarettes. If you need help quitting, ask your health care provider. Do not use street drugs. Do not share needles. Ask your health care provider for help if you need support or information about quitting drugs. General instructions Schedule regular health, dental, and eye exams. Stay current with your vaccines. Tell your health care provider if: You often feel depressed. You have ever been abused or do not feel safe at home. Summary Adopting a healthy lifestyle and getting preventive care are important in promoting health and wellness. Follow your health care provider's instructions about healthy diet, exercising, and getting tested or screened for diseases. Follow your health care provider's instructions on monitoring your cholesterol and blood pressure. This information is not intended to replace advice given to you by your health care provider. Make sure you discuss any questions you have with your health care provider. Document Revised: 02/15/2021 Document Reviewed: 02/15/2021 Elsevier Patient Education  2023 [employer] Inc[employer]  

## 2022-09-23 LAB — COMPREHENSIVE METABOLIC PANEL
AG Ratio: 1.8 (calc) (ref 1.0–2.5)
ALT: 16 U/L (ref 9–46)
AST: 29 U/L (ref 10–35)
Albumin: 4.7 g/dL (ref 3.6–5.1)
Alkaline phosphatase (APISO): 59 U/L (ref 35–144)
BUN: 13 mg/dL (ref 7–25)
CO2: 24 mmol/L (ref 20–32)
Calcium: 9.2 mg/dL (ref 8.6–10.3)
Chloride: 102 mmol/L (ref 98–110)
Creat: 0.94 mg/dL (ref 0.70–1.35)
Globulin: 2.6 g/dL (calc) (ref 1.9–3.7)
Glucose, Bld: 85 mg/dL (ref 65–99)
Potassium: 4.2 mmol/L (ref 3.5–5.3)
Sodium: 139 mmol/L (ref 135–146)
Total Bilirubin: 1.2 mg/dL (ref 0.2–1.2)
Total Protein: 7.3 g/dL (ref 6.1–8.1)

## 2022-09-23 LAB — CBC
HCT: 44.5 % (ref 38.5–50.0)
Hemoglobin: 15.3 g/dL (ref 13.2–17.1)
MCH: 30.3 pg (ref 27.0–33.0)
MCHC: 34.4 g/dL (ref 32.0–36.0)
MCV: 88.1 fL (ref 80.0–100.0)
MPV: 11.9 fL (ref 7.5–12.5)
Platelets: 211 10*3/uL (ref 140–400)
RBC: 5.05 10*6/uL (ref 4.20–5.80)
RDW: 12.5 % (ref 11.0–15.0)
WBC: 7 10*3/uL (ref 3.8–10.8)

## 2022-09-23 LAB — PSA: PSA: 0.7 ng/mL (ref <=4.00)

## 2022-09-23 LAB — LIPID PANEL
Cholesterol: 159 mg/dL (ref <200)
HDL: 45 mg/dL (ref >=40)
LDL Cholesterol (Calc): 93 mg/dL (calc)
Non-HDL Cholesterol (Calc): 114 mg/dL (calc) (ref <130)
Total CHOL/HDL Ratio: 3.5 (calc) (ref <5.0)
Triglycerides: 110 mg/dL (ref <150)

## 2022-09-23 LAB — TSH: TSH: 1.64 mIU/L (ref 0.40–4.50)

## 2022-09-23 LAB — HEMOGLOBIN A1C
Hgb A1c MFr Bld: 5.8 % of total Hgb — ABNORMAL HIGH (ref <5.7)
Mean Plasma Glucose: 120 mg/dL
eAG (mmol/L): 6.6 mmol/L

## 2022-10-06 ENCOUNTER — Encounter: Payer: Self-pay | Admitting: Family Medicine

## 2022-10-07 ENCOUNTER — Other Ambulatory Visit (HOSPITAL_COMMUNITY): Payer: Self-pay

## 2022-10-26 ENCOUNTER — Other Ambulatory Visit (HOSPITAL_COMMUNITY): Payer: Self-pay

## 2022-10-26 ENCOUNTER — Encounter (HOSPITAL_COMMUNITY): Payer: Self-pay

## 2022-10-27 ENCOUNTER — Other Ambulatory Visit (HOSPITAL_COMMUNITY): Payer: Self-pay

## 2022-10-27 MED ORDER — FINASTERIDE 1 MG PO TABS
1.0000 mg | ORAL_TABLET | Freq: Every day | ORAL | 3 refills | Status: DC
  Filled 2022-10-27 – 2022-11-07 (×2): qty 30, 30d supply, fill #0
  Filled 2022-12-04: qty 30, 30d supply, fill #1
  Filled 2023-01-02 – 2023-01-06 (×2): qty 30, 30d supply, fill #2
  Filled 2023-02-21: qty 30, 30d supply, fill #3
  Filled 2023-03-27: qty 30, 30d supply, fill #4
  Filled 2023-04-22: qty 30, 30d supply, fill #5
  Filled 2023-05-23: qty 30, 30d supply, fill #6
  Filled 2023-06-22: qty 30, 30d supply, fill #7
  Filled 2023-07-20: qty 30, 30d supply, fill #8
  Filled 2023-08-18: qty 30, 30d supply, fill #9
  Filled 2023-09-18: qty 30, 30d supply, fill #10

## 2022-11-01 ENCOUNTER — Other Ambulatory Visit: Payer: Self-pay

## 2022-11-01 ENCOUNTER — Encounter (HOSPITAL_COMMUNITY): Payer: Self-pay

## 2022-11-01 ENCOUNTER — Other Ambulatory Visit (HOSPITAL_COMMUNITY): Payer: Self-pay

## 2022-11-07 ENCOUNTER — Other Ambulatory Visit (HOSPITAL_COMMUNITY): Payer: Self-pay

## 2022-11-08 ENCOUNTER — Other Ambulatory Visit (HOSPITAL_COMMUNITY): Payer: Self-pay

## 2022-11-08 ENCOUNTER — Encounter (HOSPITAL_COMMUNITY): Payer: Self-pay

## 2022-11-09 ENCOUNTER — Other Ambulatory Visit: Payer: Self-pay

## 2022-11-09 ENCOUNTER — Other Ambulatory Visit (HOSPITAL_COMMUNITY): Payer: Self-pay

## 2022-11-15 ENCOUNTER — Other Ambulatory Visit: Payer: Self-pay

## 2022-12-05 ENCOUNTER — Other Ambulatory Visit (HOSPITAL_COMMUNITY): Payer: Self-pay

## 2023-01-02 ENCOUNTER — Other Ambulatory Visit (HOSPITAL_COMMUNITY): Payer: Self-pay

## 2023-01-06 ENCOUNTER — Other Ambulatory Visit: Payer: Self-pay

## 2023-01-23 ENCOUNTER — Encounter: Payer: Self-pay | Admitting: *Deleted

## 2023-02-22 ENCOUNTER — Other Ambulatory Visit: Payer: Self-pay

## 2023-02-22 ENCOUNTER — Other Ambulatory Visit (HOSPITAL_COMMUNITY): Payer: Self-pay

## 2023-02-22 ENCOUNTER — Encounter: Payer: Self-pay | Admitting: Pharmacist

## 2023-03-01 DIAGNOSIS — D1801 Hemangioma of skin and subcutaneous tissue: Secondary | ICD-10-CM | POA: Diagnosis not present

## 2023-03-01 DIAGNOSIS — D2260 Melanocytic nevi of unspecified upper limb, including shoulder: Secondary | ICD-10-CM | POA: Diagnosis not present

## 2023-03-01 DIAGNOSIS — Z87898 Personal history of other specified conditions: Secondary | ICD-10-CM | POA: Diagnosis not present

## 2023-03-01 DIAGNOSIS — L918 Other hypertrophic disorders of the skin: Secondary | ICD-10-CM | POA: Diagnosis not present

## 2023-03-01 DIAGNOSIS — D225 Melanocytic nevi of trunk: Secondary | ICD-10-CM | POA: Diagnosis not present

## 2023-03-01 DIAGNOSIS — D2270 Melanocytic nevi of unspecified lower limb, including hip: Secondary | ICD-10-CM | POA: Diagnosis not present

## 2023-03-01 DIAGNOSIS — D239 Other benign neoplasm of skin, unspecified: Secondary | ICD-10-CM | POA: Diagnosis not present

## 2023-03-01 DIAGNOSIS — L814 Other melanin hyperpigmentation: Secondary | ICD-10-CM | POA: Diagnosis not present

## 2023-03-01 DIAGNOSIS — L821 Other seborrheic keratosis: Secondary | ICD-10-CM | POA: Diagnosis not present

## 2023-03-28 ENCOUNTER — Other Ambulatory Visit (HOSPITAL_COMMUNITY): Payer: Self-pay

## 2023-04-22 ENCOUNTER — Other Ambulatory Visit: Payer: Self-pay

## 2023-04-22 ENCOUNTER — Other Ambulatory Visit (HOSPITAL_COMMUNITY): Payer: Self-pay

## 2023-05-23 ENCOUNTER — Other Ambulatory Visit (HOSPITAL_COMMUNITY): Payer: Self-pay

## 2023-06-22 ENCOUNTER — Other Ambulatory Visit (HOSPITAL_COMMUNITY): Payer: Self-pay

## 2023-06-26 ENCOUNTER — Other Ambulatory Visit (HOSPITAL_BASED_OUTPATIENT_CLINIC_OR_DEPARTMENT_OTHER): Payer: Self-pay

## 2023-06-26 MED ORDER — COVID-19 MRNA VAC-TRIS(PFIZER) 30 MCG/0.3ML IM SUSY
0.3000 mL | PREFILLED_SYRINGE | Freq: Once | INTRAMUSCULAR | 0 refills | Status: AC
  Filled 2023-06-26: qty 0.3, 1d supply, fill #0

## 2023-07-20 ENCOUNTER — Other Ambulatory Visit: Payer: Self-pay

## 2023-08-09 ENCOUNTER — Encounter: Payer: 59 | Admitting: Family Medicine

## 2023-08-18 ENCOUNTER — Other Ambulatory Visit (HOSPITAL_COMMUNITY): Payer: Self-pay

## 2023-09-15 ENCOUNTER — Other Ambulatory Visit: Payer: Self-pay

## 2023-09-18 ENCOUNTER — Other Ambulatory Visit (HOSPITAL_COMMUNITY): Payer: Self-pay

## 2023-09-26 ENCOUNTER — Encounter: Payer: 59 | Admitting: Family Medicine

## 2023-09-26 ENCOUNTER — Other Ambulatory Visit: Payer: Self-pay

## 2023-09-26 ENCOUNTER — Ambulatory Visit (INDEPENDENT_AMBULATORY_CARE_PROVIDER_SITE_OTHER): Payer: 59 | Admitting: Family Medicine

## 2023-09-26 ENCOUNTER — Encounter: Payer: Self-pay | Admitting: Family Medicine

## 2023-09-26 ENCOUNTER — Other Ambulatory Visit (HOSPITAL_COMMUNITY): Payer: Self-pay

## 2023-09-26 VITALS — BP 130/84 | HR 56 | Ht 73.0 in | Wt 254.6 lb

## 2023-09-26 DIAGNOSIS — Z23 Encounter for immunization: Secondary | ICD-10-CM

## 2023-09-26 DIAGNOSIS — Z125 Encounter for screening for malignant neoplasm of prostate: Secondary | ICD-10-CM | POA: Diagnosis not present

## 2023-09-26 DIAGNOSIS — E7800 Pure hypercholesterolemia, unspecified: Secondary | ICD-10-CM

## 2023-09-26 DIAGNOSIS — Z0000 Encounter for general adult medical examination without abnormal findings: Secondary | ICD-10-CM | POA: Diagnosis not present

## 2023-09-26 DIAGNOSIS — R7303 Prediabetes: Secondary | ICD-10-CM | POA: Diagnosis not present

## 2023-09-26 MED ORDER — ATORVASTATIN CALCIUM 20 MG PO TABS
20.0000 mg | ORAL_TABLET | Freq: Every day | ORAL | 3 refills | Status: AC
  Filled 2023-09-26 – 2023-10-15 (×2): qty 90, 90d supply, fill #0
  Filled 2024-01-18: qty 90, 90d supply, fill #1
  Filled 2024-05-06 (×2): qty 90, 90d supply, fill #2
  Filled 2024-09-03: qty 90, 90d supply, fill #3

## 2023-09-26 NOTE — Patient Instructions (Signed)
Health Maintenance, Male Adopting a healthy lifestyle and getting preventive care are important in promoting health and wellness. Ask your health care provider about: The right schedule for you to have regular tests and exams. Things you can do on your own to prevent diseases and keep yourself healthy. What should I know about diet, weight, and exercise? Eat a healthy diet  Eat a diet that includes plenty of vegetables, fruits, low-fat dairy products, and lean protein. Do not eat a lot of foods that are high in solid fats, added sugars, or sodium. Maintain a healthy weight Body mass index (BMI) is a measurement that can be used to identify possible weight problems. It estimates body fat based on height and weight. Your health care provider can help determine your BMI and help you achieve or maintain a healthy weight. Get regular exercise Get regular exercise. This is one of the most important things you can do for your health. Most adults should: Exercise for at least 150 minutes each week. The exercise should increase your heart rate and make you sweat (moderate-intensity exercise). Do strengthening exercises at least twice a week. This is in addition to the moderate-intensity exercise. Spend less time sitting. Even light physical activity can be beneficial. Watch cholesterol and blood lipids Have your blood tested for lipids and cholesterol at 61 years of age, then have this test every 5 years. You may need to have your cholesterol levels checked more often if: Your lipid or cholesterol levels are high. You are older than 61 years of age. You are at high risk for heart disease. What should I know about cancer screening? Many types of cancers can be detected early and may often be prevented. Depending on your health history and family history, you may need to have cancer screening at various ages. This may include screening for: Colorectal cancer. Prostate cancer. Skin cancer. Lung  cancer. What should I know about heart disease, diabetes, and high blood pressure? Blood pressure and heart disease High blood pressure causes heart disease and increases the risk of stroke. This is more likely to develop in people who have high blood pressure readings or are overweight. Talk with your health care provider about your target blood pressure readings. Have your blood pressure checked: Every 3-5 years if you are 18-39 years of age. Every year if you are 40 years old or older. If you are between the ages of 65 and 75 and are a current or former smoker, ask your health care provider if you should have a one-time screening for abdominal aortic aneurysm (AAA). Diabetes Have regular diabetes screenings. This checks your fasting blood sugar level. Have the screening done: Once every three years after age 45 if you are at a normal weight and have a low risk for diabetes. More often and at a younger age if you are overweight or have a high risk for diabetes. What should I know about preventing infection? Hepatitis B If you have a higher risk for hepatitis B, you should be screened for this virus. Talk with your health care provider to find out if you are at risk for hepatitis B infection. Hepatitis C Blood testing is recommended for: Everyone born from 1945 through 1965. Anyone with known risk factors for hepatitis C. Sexually transmitted infections (STIs) You should be screened each year for STIs, including gonorrhea and chlamydia, if: You are sexually active and are younger than 61 years of age. You are older than 61 years of age and your   health care provider tells you that you are at risk for this type of infection. Your sexual activity has changed since you were last screened, and you are at increased risk for chlamydia or gonorrhea. Ask your health care provider if you are at risk. Ask your health care provider about whether you are at high risk for HIV. Your health care provider  may recommend a prescription medicine to help prevent HIV infection. If you choose to take medicine to prevent HIV, you should first get tested for HIV. You should then be tested every 3 months for as long as you are taking the medicine. Follow these instructions at home: Alcohol use Do not drink alcohol if your health care provider tells you not to drink. If you drink alcohol: Limit how much you have to 0-2 drinks a day. Know how much alcohol is in your drink. In the [place] one drink equals one 12 oz bottle of beer (355 mL), one 5 oz glass of wine (148 mL), or one 1 oz glass of hard liquor (44 mL). Lifestyle Do not use any products that contain nicotine or tobacco. These products include cigarettes, chewing tobacco, and vaping devices, such as e-cigarettes. If you need help quitting, ask your health care provider. Do not use street drugs. Do not share needles. Ask your health care provider for help if you need support or information about quitting drugs. General instructions Schedule regular health, dental, and eye exams. Stay current with your vaccines. Tell your health care provider if: You often feel depressed. You have ever been abused or do not feel safe at home. Summary Adopting a healthy lifestyle and getting preventive care are important in promoting health and wellness. Follow your health care provider's instructions about healthy diet, exercising, and getting tested or screened for diseases. Follow your health care provider's instructions on monitoring your cholesterol and blood pressure. This information is not intended to replace advice given to you by your health care provider. Make sure you discuss any questions you have with your health care provider. Document Revised: 02/15/2021 Document Reviewed: 02/15/2021 Elsevier Patient Education  2024 [employer]  

## 2023-09-26 NOTE — Progress Notes (Signed)
Office Note 09/26/2023  CC:  Chief Complaint  Patient presents with   Annual Exam    Pt is fasting.     HPI:  Patient is a 62 y.o. male who is here for annual health maintenance exam and follow-up hypercholesterolemia. Feeling well.  Works out about 3 days a week. He spends time between a beach house and [place]  He enjoys boating and fishing.  He is retired.  He is going on a long trip to [place] soon.  He is planning to start better dietary habits.  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   Baker's cyst of knee, left 07/2019   Gross hematuria 06/2013   Right kidney radiolucent stone believed to be uric acid stone, cysto normal:  potassium citrate ER 20 mEQ (urocit K) bid by urology   History of kidney stones    Hyperlipidemia, mixed 2017   TLC recommended 2017 and 2018.  Good response to statin 2019. Stable 2020->ok for pt to take statin qod.   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  Followed by Dr. Everardo All.   No changes on u/s f/u 09/2012.  Repeat u/s planned by Dr. Everardo All as of 02/2017 f/u.    Past Surgical History:  Procedure Laterality Date   BIOPSY THYROID     COLONOSCOPY     COLONOSCOPY W/ POLYPECTOMY  07/10/2017   2018 and 12/2020->Adenomatous: recall 3-5 yrs   EXTRACORPOREAL SHOCK WAVE LITHOTRIPSY Right 10/17/2016   Procedure: RIGHT EXTRACORPOREAL SHOCK WAVE LITHOTRIPSY (ESWL);  Surgeon: Jethro Bolus, MD;  Location: [facility];  Service: Urology;  Laterality: Right;   KNEE  ARTHROSCOPY     left   POLYPECTOMY      Family History  Problem Relation Age of Onset   Cancer Father        bladder   Colon cancer Neg Hx    Stomach cancer Neg Hx    Colon polyps Neg Hx    Esophageal cancer Neg Hx    Rectal cancer Neg Hx     Social History   Socioeconomic History   Marital status: Married    Spouse name: Not on file   Number of children: Not on file   Years of education: Not on file   Highest education level: Not on file  Occupational History   Not on file  Tobacco Use   Smoking status: Never   Smokeless tobacco: Never  Vaping Use   Vaping status: Never Used  Substance and Sexual Activity   Alcohol use: Yes    Alcohol/week: 12.0 - 18.0 standard drinks of alcohol    Types: 12 - 18 Cans of beer per week    Comment: 12-18 beers per week   Drug use: No   Sexual activity: Not on file  Other Topics Concern   Not on file  Social History Narrative   Married, 61 y/o girl and 55 boy.   Occupation: Product/process development scientist (residential).   Orig from [place], [place].   Enjoys  fishing.  Exercises 2-3 times per week: 30 min elliptical.  Also plays soccer.   No tob, occ beer, no drugs.         Social Drivers of Corporate investment banker Strain: Not on file  Food Insecurity: Not on file  Transportation Needs: Not on file  Physical Activity: Not on file  Stress: Not on file  Social Connections: Not on file  Intimate Partner Violence: Not on file    Outpatient Medications Prior to Visit  Medication Sig Dispense Refill   cetirizine (ZYRTEC) 10 MG tablet Take 10 mg by mouth daily.     clobetasol ointment (TEMOVATE) 0.05 % Apply 2 times a day to lesion on left upper arm.  Stop when smooth. Do not apply to face or skin folds. 60 g 3   finasteride (PROPECIA) 1 MG tablet Take 1 tablet (1 mg total) by mouth daily. 90 tablet 3   mupirocin cream (BACTROBAN) 2 % Apply 1 application topically 2 (two) times daily. 15 g 0   Omega-3 Fatty Acids (FISH OIL PO) Take 4  tablets by mouth daily.     Sodium Fluoride 1.1 % PSTE Brush teeth 3 times daily after meals and before bedtime. 100 mL 0   atorvastatin (LIPITOR) 20 MG tablet Take 1 tablet (20 mg total) by mouth daily. 90 tablet 3   No facility-administered medications prior to visit.    Allergies  Allergen Reactions   Beta Adrenergic Blockers Other (See Comments)    Not good for me per dr.   Ibuprofen     REACTION: lip swelling   Nsaids Other (See Comments)    Lip swelling    Review of Systems  Constitutional:  Negative for appetite change, chills, fatigue and fever.  HENT:  Negative for congestion, dental problem, ear pain and sore throat.   Eyes:  Negative for discharge, redness and visual disturbance.  Respiratory:  Negative for cough, chest tightness, shortness of breath and wheezing.   Cardiovascular:  Negative for chest pain, palpitations and leg swelling.  Gastrointestinal:  Negative for abdominal pain, blood in stool, diarrhea, nausea and vomiting.  Genitourinary:  Negative for difficulty urinating, dysuria, flank pain, frequency, hematuria and urgency.  Musculoskeletal:  Negative for arthralgias, back pain, joint swelling, myalgias and neck stiffness.  Skin:  Negative for pallor and rash.  Neurological:  Negative for dizziness, speech difficulty, weakness and headaches.  Hematological:  Negative for adenopathy. Does not bruise/bleed easily.  Psychiatric/Behavioral:  Negative for confusion and sleep disturbance. The patient is not nervous/anxious.     PE;    09/26/2023    9:37 AM 09/22/2022    1:55 PM 08/02/2021   11:09 AM  Vitals with BMI  Height 6\' 1"  6\' 1"  6\' 5"   Weight 254 lbs 10 oz 251 lbs 13 oz 246 lbs 3 oz  BMI 33.6 33.23 29.19  Systolic 130 135 272  Diastolic 84 83 82  Pulse 56 51 64    Gen: Alert, well appearing.  Patient is oriented to person, place, time, and situation. AFFECT: pleasant, lucid thought and speech. ENT: Ears: EACs clear, normal epithelium.  TMs  with good light reflex and landmarks bilaterally.  Eyes: no injection, icteris, swelling, or exudate.  EOMI, PERRLA. Nose: no drainage or turbinate edema/swelling.  No injection or focal lesion.  Mouth: lips without lesion/swelling.  Oral mucosa pink and moist.  Dentition intact and without obvious caries or gingival swelling.  Oropharynx without erythema, exudate, or swelling.  Neck: supple/nontender.  No LAD, mass, or TM.  Carotid pulses 2+ bilaterally, without bruits. CV: RRR, no m/r/g.   LUNGS: CTA bilat, nonlabored resps, good aeration in all lung fields. ABD: soft, NT, ND, BS normal.  No hepatospenomegaly or mass.  No bruits. EXT: no clubbing, cyanosis, or edema.  Musculoskeletal: no joint swelling, erythema, warmth, or tenderness.  ROM of all joints intact. Skin - no sores or suspicious lesions or rashes or color changes  Pertinent labs:  Lab Results  Component Value Date   TSH 1.64 09/22/2022   Lab Results  Component Value Date   WBC 7.0 09/22/2022   HGB 15.3 09/22/2022   HCT 44.5 09/22/2022   MCV 88.1 09/22/2022   PLT 211 09/22/2022   Lab Results  Component Value Date   CREATININE 0.94 09/22/2022   BUN 13 09/22/2022   NA 139 09/22/2022   K 4.2 09/22/2022   CL 102 09/22/2022   CO2 24 09/22/2022   Lab Results  Component Value Date   ALT 16 09/22/2022   AST 29 09/22/2022   ALKPHOS 66 08/02/2021   BILITOT 1.2 09/22/2022   Lab Results  Component Value Date   CHOL 159 09/22/2022   Lab Results  Component Value Date   HDL 45 09/22/2022   Lab Results  Component Value Date   LDLCALC 93 09/22/2022   Lab Results  Component Value Date   TRIG 110 09/22/2022   Lab Results  Component Value Date   CHOLHDL 3.5 09/22/2022   Lab Results  Component Value Date   PSA 0.70 09/22/2022   PSA 0.58 08/02/2021   PSA 0.55 07/20/2020   Lab Results  Component Value Date   HGBA1C 5.8 (H) 09/22/2022   ASSESSMENT AND PLAN:   #1 health maintenance exam: Reviewed age and  gender appropriate health maintenance issues (prudent diet, regular exercise, health risks of tobacco and excessive alcohol, use of seatbelts, fire alarms in home, use of sunscreen).  Also reviewed age and gender appropriate health screening as well as vaccine recommendations. Vaccines: Tdap->given today .  Flu->given today.  Otherwise Up-to-date Labs: fasting HP, Hba1c (prediabetes), and PSA today. Prostate ca screening: PSA today. Colon ca screening: hx adenoma, most recent 12/2020-->recall 5 yrs  #2 hypercholesterolemia, doing well on a atorvastatin 20 mg every other day. Lipid panel today.  An After Visit Summary was printed and given to the patient.  FOLLOW UP:  Return in about 1 year (around 09/25/2024) for annual CPE (fasting).  Signed:  Santiago Bumpers, MD           09/26/2023

## 2023-09-27 LAB — CBC
HCT: 44.7 % (ref 38.5–50.0)
Hemoglobin: 15 g/dL (ref 13.2–17.1)
MCH: 30.1 pg (ref 27.0–33.0)
MCHC: 33.6 g/dL (ref 32.0–36.0)
MCV: 89.8 fL (ref 80.0–100.0)
MPV: 11.5 fL (ref 7.5–12.5)
Platelets: 232 10*3/uL (ref 140–400)
RBC: 4.98 10*6/uL (ref 4.20–5.80)
RDW: 12.4 % (ref 11.0–15.0)
WBC: 6.8 10*3/uL (ref 3.8–10.8)

## 2023-09-27 LAB — COMPREHENSIVE METABOLIC PANEL
AG Ratio: 1.7 (calc) (ref 1.0–2.5)
ALT: 18 U/L (ref 9–46)
AST: 25 U/L (ref 10–35)
Albumin: 4.7 g/dL (ref 3.6–5.1)
Alkaline phosphatase (APISO): 78 U/L (ref 35–144)
BUN: 14 mg/dL (ref 7–25)
CO2: 28 mmol/L (ref 20–32)
Calcium: 9.6 mg/dL (ref 8.6–10.3)
Chloride: 103 mmol/L (ref 98–110)
Creat: 0.99 mg/dL (ref 0.70–1.35)
Globulin: 2.8 g/dL (ref 1.9–3.7)
Glucose, Bld: 97 mg/dL (ref 65–99)
Potassium: 4.5 mmol/L (ref 3.5–5.3)
Sodium: 140 mmol/L (ref 135–146)
Total Bilirubin: 0.7 mg/dL (ref 0.2–1.2)
Total Protein: 7.5 g/dL (ref 6.1–8.1)

## 2023-09-27 LAB — LIPID PANEL
Cholesterol: 159 mg/dL (ref <200)
HDL: 44 mg/dL (ref >=40)
LDL Cholesterol (Calc): 90 mg/dL
Non-HDL Cholesterol (Calc): 115 mg/dL (ref <130)
Total CHOL/HDL Ratio: 3.6 (calc) (ref <5.0)
Triglycerides: 152 mg/dL — ABNORMAL HIGH (ref <150)

## 2023-09-27 LAB — HEMOGLOBIN A1C
Hgb A1c MFr Bld: 5.8 %{Hb} — ABNORMAL HIGH (ref <5.7)
Mean Plasma Glucose: 120 mg/dL
eAG (mmol/L): 6.6 mmol/L

## 2023-09-27 LAB — PSA: PSA: 0.86 ng/mL (ref <=4.00)

## 2023-10-15 ENCOUNTER — Other Ambulatory Visit (HOSPITAL_COMMUNITY): Payer: Self-pay

## 2023-10-15 ENCOUNTER — Other Ambulatory Visit: Payer: Self-pay | Admitting: Family Medicine

## 2023-10-16 ENCOUNTER — Other Ambulatory Visit: Payer: Self-pay

## 2023-10-16 ENCOUNTER — Other Ambulatory Visit (HOSPITAL_COMMUNITY): Payer: Self-pay

## 2023-10-17 ENCOUNTER — Other Ambulatory Visit (HOSPITAL_COMMUNITY): Payer: Self-pay

## 2023-10-17 ENCOUNTER — Other Ambulatory Visit: Payer: Self-pay

## 2023-10-17 MED ORDER — FINASTERIDE 1 MG PO TABS
1.0000 mg | ORAL_TABLET | Freq: Every day | ORAL | 3 refills | Status: DC
  Filled 2023-10-17: qty 90, 90d supply, fill #0
  Filled 2024-01-18: qty 90, 90d supply, fill #1
  Filled 2024-05-06 (×2): qty 90, 90d supply, fill #2
  Filled 2024-09-03: qty 90, 90d supply, fill #3

## 2024-01-18 ENCOUNTER — Other Ambulatory Visit (HOSPITAL_COMMUNITY): Payer: Self-pay

## 2024-01-22 ENCOUNTER — Encounter: Payer: Self-pay | Admitting: Family Medicine

## 2024-01-22 DIAGNOSIS — L0591 Pilonidal cyst without abscess: Secondary | ICD-10-CM

## 2024-01-22 MED ORDER — MUPIROCIN CALCIUM 2 % EX CREA: 1.0000 | TOPICAL_CREAM | Freq: Two times a day (BID) | CUTANEOUS | 1 refills | Status: AC

## 2024-01-22 NOTE — Telephone Encounter (Signed)
 Okay, mupirocin sent

## 2024-02-26 ENCOUNTER — Encounter: Payer: Self-pay | Admitting: Family Medicine

## 2024-05-06 ENCOUNTER — Other Ambulatory Visit: Payer: Self-pay

## 2024-05-06 ENCOUNTER — Other Ambulatory Visit (HOSPITAL_COMMUNITY): Payer: Self-pay

## 2024-05-07 ENCOUNTER — Other Ambulatory Visit: Payer: Self-pay

## 2024-09-03 ENCOUNTER — Other Ambulatory Visit (HOSPITAL_COMMUNITY): Payer: Self-pay

## 2024-09-26 ENCOUNTER — Ambulatory Visit: Payer: 59 | Admitting: Family Medicine

## 2024-09-26 ENCOUNTER — Encounter: Payer: Self-pay | Admitting: Family Medicine

## 2024-09-26 ENCOUNTER — Other Ambulatory Visit (HOSPITAL_COMMUNITY): Payer: Self-pay

## 2024-09-26 VITALS — BP 120/71 | HR 57 | Temp 98.2°F | Ht 73.5 in | Wt 255.6 lb

## 2024-09-26 DIAGNOSIS — E7800 Pure hypercholesterolemia, unspecified: Secondary | ICD-10-CM

## 2024-09-26 DIAGNOSIS — Z0000 Encounter for general adult medical examination without abnormal findings: Secondary | ICD-10-CM | POA: Diagnosis not present

## 2024-09-26 DIAGNOSIS — Z23 Encounter for immunization: Secondary | ICD-10-CM

## 2024-09-26 DIAGNOSIS — Z125 Encounter for screening for malignant neoplasm of prostate: Secondary | ICD-10-CM

## 2024-09-26 DIAGNOSIS — R7303 Prediabetes: Secondary | ICD-10-CM

## 2024-09-26 MED ORDER — FINASTERIDE 1 MG PO TABS
1.0000 mg | ORAL_TABLET | Freq: Every day | ORAL | 3 refills | Status: AC
  Filled 2024-09-26: qty 90, 90d supply, fill #0

## 2024-09-26 NOTE — Progress Notes (Signed)
 Office Note 09/26/2024  CC:  Chief Complaint  Patient presents with   Annual Exam    Pt is fasting   HPI:  Patient is a 63 y.o. male who is here for annual health maintenance exam and follow-up hypercholesterolemia. He is feeling well. Not a particularly good diet over the last few months. He takes his atorvastatin  20 mg every other day.  He does not have any side effects.  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   Baker's cyst of knee, left 07/2019   Gross hematuria 06/2013   Right kidney radiolucent stone believed to be uric acid stone, cysto normal:  potassium citrate ER 20 mEQ (urocit K) bid by urology   History of kidney stones    Hyperlipidemia, mixed 2017   TLC recommended 2017 and 2018.  Good response to statin 2019. Stable 2020->ok for pt to take statin qod.   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  Followed by Dr. Kassie.   No changes on u/s f/u 09/2012.  Repeat u/s planned by Dr. Kassie as of 02/2017 f/u.    Past Surgical History:  Procedure Laterality Date   BIOPSY THYROID      COLONOSCOPY     COLONOSCOPY W/ POLYPECTOMY  07/10/2017   2018 and 12/2020->Adenomatous: recall 3-5 yrs   EXTRACORPOREAL SHOCK WAVE LITHOTRIPSY Right 10/17/2016   Procedure: RIGHT EXTRACORPOREAL SHOCK WAVE LITHOTRIPSY (ESWL);  Surgeon: Arlena Gal, MD;  Location: [facility];  Service: Urology;  Laterality: Right;   KNEE ARTHROSCOPY     left   POLYPECTOMY      Family History  Problem Relation Age of Onset   Cancer  Father        bladder   Colon cancer Neg Hx    Stomach cancer Neg Hx    Colon polyps Neg Hx    Esophageal cancer Neg Hx    Rectal cancer Neg Hx     Social History   Socioeconomic History   Marital status: Married    Spouse name: Not on file   Number of children: Not on file   Years of education: Not on file   Highest education level: Not on file  Occupational History   Not on file  Tobacco Use   Smoking status: Never   Smokeless tobacco: Never  Vaping Use   Vaping status: Never Used  Substance and Sexual Activity   Alcohol use: Yes    Alcohol/week: 12.0 - 18.0 standard drinks of alcohol    Types: 12 - 18 Cans of beer per week    Comment: 12-18 beers per week   Drug use: No   Sexual activity: Not on file  Other Topics Concern   Not on file  Social History Narrative   Married, 62 y/o girl and 29 boy.   Occupation: product/process development scientist (residential).   Orig from [place], [place].   Enjoys fishing.  Exercises 2-3 times per week: 30 min elliptical.  Also plays soccer.   No tob, occ beer, no drugs.  Social Drivers of Health   Tobacco Use: Low Risk (09/26/2024)   Patient History    Smoking Tobacco Use: Never    Smokeless Tobacco Use: Never    Passive Exposure: Not on file  Financial Resource Strain: Not on file  Food Insecurity: Not on file  Transportation Needs: Not on file  Physical Activity: Not on file  Stress: Not on file  Social Connections: Not on file  Intimate Partner Violence: Not on file  Depression (PHQ2-9): Low Risk (09/26/2024)   Depression (PHQ2-9)    PHQ-2 Score: 3  Alcohol Screen: Not on file  Housing: Not on file  Utilities: Not on file  Health Literacy: Not on file    Outpatient Medications Prior to Visit  Medication Sig Dispense Refill   atorvastatin  (LIPITOR) 20 MG tablet Take 1 tablet (20 mg total) by mouth daily. (Patient taking differently: Take 20 mg by mouth every other day.) 90 tablet 3   cetirizine (ZYRTEC) 10 MG tablet Take  10 mg by mouth daily. (Patient taking differently: Take 10 mg by mouth as needed.)     mupirocin  cream (BACTROBAN ) 2 % Apply 1 Application topically 2 (two) times daily. (Patient taking differently: Apply 1 Application topically as needed.) 15 g 1   clobetasol  ointment (TEMOVATE ) 0.05 % Apply 2 times a day to lesion on left upper arm.  Stop when smooth. Do not apply to face or skin folds. 60 g 3   Omega-3 Fatty Acids (FISH OIL PO) Take 4 tablets by mouth daily.     finasteride  (PROPECIA ) 1 MG tablet Take 1 tablet (1 mg total) by mouth daily. 90 tablet 3   Sodium Fluoride  1.1 % PSTE Brush teeth 3 times daily after meals and before bedtime. (Patient not taking: Reported on 09/26/2024) 100 mL 0   No facility-administered medications prior to visit.    Allergies[1]  Review of Systems  Constitutional:  Negative for appetite change, chills, fatigue and fever.  HENT:  Negative for congestion, dental problem, ear pain and sore throat.   Eyes:  Negative for discharge, redness and visual disturbance.  Respiratory:  Negative for cough, chest tightness, shortness of breath and wheezing.   Cardiovascular:  Negative for chest pain, palpitations and leg swelling.  Gastrointestinal:  Negative for abdominal pain, blood in stool, diarrhea, nausea and vomiting.  Genitourinary:  Negative for difficulty urinating, dysuria, flank pain, frequency, hematuria and urgency.  Musculoskeletal:  Negative for arthralgias, back pain, joint swelling, myalgias and neck stiffness.  Skin:  Negative for pallor and rash.  Neurological:  Negative for dizziness, speech difficulty, weakness and headaches.  Hematological:  Negative for adenopathy. Does not bruise/bleed easily.  Psychiatric/Behavioral:  Negative for confusion and sleep disturbance. The patient is not nervous/anxious.     PE;    09/26/2024    9:02 AM 09/26/2023    9:37 AM 09/22/2022    1:55 PM  Vitals with BMI  Height 6' 1.5 6' 1 6' 1  Weight 255 lbs 10  oz 254 lbs 10 oz 251 lbs 13 oz  BMI 33.26 33.6 33.23  Systolic 120 130 864  Diastolic 71 84 83  Pulse 57 56 51   Gen: Alert, well appearing.  Patient is oriented to person, place, time, and situation. AFFECT: pleasant, lucid thought and speech. ENT: Ears: EACs clear, normal epithelium.  TMs with good light reflex and landmarks bilaterally.  Eyes: no injection, icteris, swelling, or exudate.  EOMI, PERRLA. Nose: no drainage or turbinate edema/swelling.  No injection or  focal lesion.  Mouth: lips without lesion/swelling.  Oral mucosa pink and moist.  Dentition intact and without obvious caries or gingival swelling.  Oropharynx without erythema, exudate, or swelling.  Neck: supple/nontender.  No LAD, mass, or TM.  Carotid pulses 2+ bilaterally, without bruits. CV: RRR, no m/r/g.   LUNGS: CTA bilat, nonlabored resps, good aeration in all lung fields. ABD: soft, NT, ND, BS normal.  No hepatospenomegaly or mass.  No bruits. EXT: no clubbing, cyanosis, or edema.  Musculoskeletal: no joint swelling, erythema, warmth, or tenderness.  ROM of all joints intact. Skin - no sores or suspicious lesions or rashes or color changes  Pertinent labs:  Lab Results  Component Value Date   TSH 1.64 09/22/2022   Lab Results  Component Value Date   WBC 6.8 09/26/2023   HGB 15.0 09/26/2023   HCT 44.7 09/26/2023   MCV 89.8 09/26/2023   PLT 232 09/26/2023   Lab Results  Component Value Date   CREATININE 0.99 09/26/2023   BUN 14 09/26/2023   NA 140 09/26/2023   K 4.5 09/26/2023   CL 103 09/26/2023   CO2 28 09/26/2023   Lab Results  Component Value Date   ALT 18 09/26/2023   AST 25 09/26/2023   ALKPHOS 66 08/02/2021   BILITOT 0.7 09/26/2023   Lab Results  Component Value Date   CHOL 159 09/26/2023   Lab Results  Component Value Date   HDL 44 09/26/2023   Lab Results  Component Value Date   LDLCALC 90 09/26/2023   Lab Results  Component Value Date   TRIG 152 (H) 09/26/2023   Lab  Results  Component Value Date   CHOLHDL 3.6 09/26/2023   Lab Results  Component Value Date   PSA 0.86 09/26/2023   PSA 0.70 09/22/2022   PSA 0.58 08/02/2021   Lab Results  Component Value Date   HGBA1C 5.8 (H) 09/26/2023   ASSESSMENT AND PLAN:   No problem-specific Assessment & Plan notes found for this encounter. #1 health maintenance exam: Reviewed age and gender appropriate health maintenance issues (prudent diet, regular exercise, health risks of tobacco and excessive alcohol, use of seatbelts, fire alarms in home, use of sunscreen).  Also reviewed age and gender appropriate health screening as well as vaccine recommendations. Vaccines: Prevnar 20 --> declined. Flu->given today.    Labs: fasting HP, Hba1c (prediabetes), and PSA today. Prostate ca screening: PSA today. Colon ca screening: hx adenoma, most recent 12/2020-->recall 5 yrs   #2 hypercholesterolemia, doing well on a atorvastatin  20 mg every other day. Lipid panel today. If LDL remains less than 100 we will change him over to a 10 mg tab every day. If greater than 100 then we will have him take his atorvastatin  20 mg tab every day instead of every other day.  #3 prediabetes. We reviewed the basic pathophysiology and monitoring and dietary/exercise recommendations. Monitor fasting glucose and hemoglobin A1c today.  An After Visit Summary was printed and given to the patient.  FOLLOW UP:  Return in about 1 year (around 09/26/2025) for annual CPE (fasting).  Signed:  Phil Kaynen Minner, MD           09/26/2024     [1]  Allergies Allergen Reactions   Beta Adrenergic Blockers Other (See Comments)    Not good for me per dr.   Ibuprofen     REACTION: lip swelling   Nsaids Other (See Comments)    Lip swelling

## 2024-09-26 NOTE — Patient Instructions (Signed)
 Health Maintenance, Male  Adopting a healthy lifestyle and getting preventive care are important in promoting health and wellness. Ask your health care provider about:  The right schedule for you to have regular tests and exams.  Things you can do on your own to prevent diseases and keep yourself healthy.  What should I know about diet, weight, and exercise?  Eat a healthy diet    Eat a diet that includes plenty of vegetables, fruits, low-fat dairy products, and lean protein.  Do not eat a lot of foods that are high in solid fats, added sugars, or sodium.  Maintain a healthy weight  Body mass index (BMI) is a measurement that can be used to identify possible weight problems. It estimates body fat based on height and weight. Your health care provider can help determine your BMI and help you achieve or maintain a healthy weight.  Get regular exercise  Get regular exercise. This is one of the most important things you can do for your health. Most adults should:  Exercise for at least 150 minutes each week. The exercise should increase your heart rate and make you sweat (moderate-intensity exercise).  Do strengthening exercises at least twice a week. This is in addition to the moderate-intensity exercise.  Spend less time sitting. Even light physical activity can be beneficial.  Watch cholesterol and blood lipids  Have your blood tested for lipids and cholesterol at 62 years of age, then have this test every 5 years.  You may need to have your cholesterol levels checked more often if:  Your lipid or cholesterol levels are high.  You are older than 62 years of age.  You are at high risk for heart disease.  What should I know about cancer screening?  Many types of cancers can be detected early and may often be prevented. Depending on your health history and family history, you may need to have cancer screening at various ages. This may include screening for:  Colorectal cancer.  Prostate cancer.  Skin cancer.  Lung  cancer.  What should I know about heart disease, diabetes, and high blood pressure?  Blood pressure and heart disease  High blood pressure causes heart disease and increases the risk of stroke. This is more likely to develop in people who have high blood pressure readings or are overweight.  Talk with your health care provider about your target blood pressure readings.  Have your blood pressure checked:  Every 3-5 years if you are 24-52 years of age.  Every year if you are 3 years old or older.  If you are between the ages of 60 and 72 and are a current or former smoker, ask your health care provider if you should have a one-time screening for abdominal aortic aneurysm (AAA).  Diabetes  Have regular diabetes screenings. This checks your fasting blood sugar level. Have the screening done:  Once every three years after age 66 if you are at a normal weight and have a low risk for diabetes.  More often and at a younger age if you are overweight or have a high risk for diabetes.  What should I know about preventing infection?  Hepatitis B  If you have a higher risk for hepatitis B, you should be screened for this virus. Talk with your health care provider to find out if you are at risk for hepatitis B infection.  Hepatitis C  Blood testing is recommended for:  Everyone born from [birth year] through 1964.  Anyone  with known risk factors for hepatitis C.  Sexually transmitted infections (STIs)  You should be screened each year for STIs, including gonorrhea and chlamydia, if:  You are sexually active and are younger than 62 years of age.  You are older than 62 years of age and your health care provider tells you that you are at risk for this type of infection.  Your sexual activity has changed since you were last screened, and you are at increased risk for chlamydia or gonorrhea. Ask your health care provider if you are at risk.  Ask your health care provider about whether you are at high risk for HIV. Your health care provider  may recommend a prescription medicine to help prevent HIV infection. If you choose to take medicine to prevent HIV, you should first get tested for HIV. You should then be tested every 3 months for as long as you are taking the medicine.  Follow these instructions at home:  Alcohol use  Do not drink alcohol if your health care provider tells you not to drink.  If you drink alcohol:  Limit how much you have to 0-2 drinks a day.  Know how much alcohol is in your drink. In the [place] one drink equals one 12 oz bottle of beer (355 mL), one 5 oz glass of wine (148 mL), or one 1 oz glass of hard liquor (44 mL).  Lifestyle  Do not use any products that contain nicotine or tobacco. These products include cigarettes, chewing tobacco, and vaping devices, such as e-cigarettes. If you need help quitting, ask your health care provider.  Do not use street drugs.  Do not share needles.  Ask your health care provider for help if you need support or information about quitting drugs.  General instructions  Schedule regular health, dental, and eye exams.  Stay current with your vaccines.  Tell your health care provider if:  You often feel depressed.  You have ever been abused or do not feel safe at home.  Summary  Adopting a healthy lifestyle and getting preventive care are important in promoting health and wellness.  Follow your health care provider's instructions about healthy diet, exercising, and getting tested or screened for diseases.  Follow your health care provider's instructions on monitoring your cholesterol and blood pressure.  This information is not intended to replace advice given to you by your health care provider. Make sure you discuss any questions you have with your health care provider.  Document Revised: 02/15/2021 Document Reviewed: 02/15/2021  Elsevier Patient Education  2024 [employer].

## 2024-09-27 ENCOUNTER — Ambulatory Visit: Payer: Self-pay | Admitting: Family Medicine

## 2024-09-27 DIAGNOSIS — E7800 Pure hypercholesterolemia, unspecified: Secondary | ICD-10-CM

## 2024-09-27 LAB — COMPREHENSIVE METABOLIC PANEL WITH GFR
AG Ratio: 1.9 (calc) (ref 1.0–2.5)
ALT: 12 U/L (ref 9–46)
AST: 24 U/L (ref 10–35)
Albumin: 4.7 g/dL (ref 3.6–5.1)
Alkaline phosphatase (APISO): 61 U/L (ref 35–144)
BUN: 16 mg/dL (ref 7–25)
CO2: 27 mmol/L (ref 20–32)
Calcium: 9.1 mg/dL (ref 8.6–10.3)
Chloride: 105 mmol/L (ref 98–110)
Creat: 0.96 mg/dL (ref 0.70–1.35)
Globulin: 2.5 g/dL (ref 1.9–3.7)
Glucose, Bld: 100 mg/dL — ABNORMAL HIGH (ref 65–99)
Potassium: 4.2 mmol/L (ref 3.5–5.3)
Sodium: 139 mmol/L (ref 135–146)
Total Bilirubin: 0.7 mg/dL (ref 0.2–1.2)
Total Protein: 7.2 g/dL (ref 6.1–8.1)
eGFR: 89 mL/min/1.73m2

## 2024-09-27 LAB — CBC WITH DIFFERENTIAL/PLATELET
Absolute Lymphocytes: 1792 {cells}/uL (ref 850–3900)
Absolute Monocytes: 615 {cells}/uL (ref 200–950)
Basophils Absolute: 58 {cells}/uL (ref 0–200)
Basophils Relative: 1 %
Eosinophils Absolute: 220 {cells}/uL (ref 15–500)
Eosinophils Relative: 3.8 %
HCT: 45 % (ref 39.4–51.1)
Hemoglobin: 14.8 g/dL (ref 13.2–17.1)
MCH: 29.6 pg (ref 27.0–33.0)
MCHC: 32.9 g/dL (ref 31.6–35.4)
MCV: 90 fL (ref 81.4–101.7)
MPV: 11.7 fL (ref 7.5–12.5)
Monocytes Relative: 10.6 %
Neutro Abs: 3115 {cells}/uL (ref 1500–7800)
Neutrophils Relative %: 53.7 %
Platelets: 220 Thousand/uL (ref 140–400)
RBC: 5 Million/uL (ref 4.20–5.80)
RDW: 12.4 % (ref 11.0–15.0)
Total Lymphocyte: 30.9 %
WBC: 5.8 Thousand/uL (ref 3.8–10.8)

## 2024-09-27 LAB — LIPID PANEL
Cholesterol: 149 mg/dL
HDL: 38 mg/dL — ABNORMAL LOW
LDL Cholesterol (Calc): 91 mg/dL
Non-HDL Cholesterol (Calc): 111 mg/dL
Total CHOL/HDL Ratio: 3.9 (calc)
Triglycerides: 103 mg/dL

## 2024-09-27 LAB — HEMOGLOBIN A1C
Hgb A1c MFr Bld: 5.8 % — ABNORMAL HIGH
Mean Plasma Glucose: 120 mg/dL
eAG (mmol/L): 6.6 mmol/L

## 2024-09-27 LAB — PSA: PSA: 1.04 ng/mL
